# Patient Record
Sex: Female | Born: 1945 | Race: White | Hispanic: No | Marital: Married | Smoking: Never smoker
Health system: Southern US, Community
[De-identification: ages and names within clinical notes are randomized; demographics above are authoritative.]

## PROBLEM LIST (undated history)

## (undated) DIAGNOSIS — M1990 Unspecified osteoarthritis, unspecified site: Secondary | ICD-10-CM

## (undated) DIAGNOSIS — Z923 Personal history of irradiation: Secondary | ICD-10-CM

## (undated) DIAGNOSIS — Z803 Family history of malignant neoplasm of breast: Secondary | ICD-10-CM

## (undated) DIAGNOSIS — C50919 Malignant neoplasm of unspecified site of unspecified female breast: Secondary | ICD-10-CM

## (undated) DIAGNOSIS — F329 Major depressive disorder, single episode, unspecified: Secondary | ICD-10-CM

## (undated) DIAGNOSIS — Z8719 Personal history of other diseases of the digestive system: Secondary | ICD-10-CM

## (undated) DIAGNOSIS — Z806 Family history of leukemia: Secondary | ICD-10-CM

## (undated) DIAGNOSIS — I10 Essential (primary) hypertension: Secondary | ICD-10-CM

## (undated) DIAGNOSIS — F419 Anxiety disorder, unspecified: Secondary | ICD-10-CM

## (undated) DIAGNOSIS — Z800 Family history of malignant neoplasm of digestive organs: Secondary | ICD-10-CM

## (undated) DIAGNOSIS — F32A Depression, unspecified: Secondary | ICD-10-CM

## (undated) DIAGNOSIS — D649 Anemia, unspecified: Secondary | ICD-10-CM

## (undated) DIAGNOSIS — M797 Fibromyalgia: Secondary | ICD-10-CM

## (undated) HISTORY — PX: NECK SURGERY: SHX720

## (undated) HISTORY — DX: Anxiety disorder, unspecified: F41.9

## (undated) HISTORY — DX: Personal history of other diseases of the digestive system: Z87.19

## (undated) HISTORY — DX: Fibromyalgia: M79.7

## (undated) HISTORY — PX: BACK SURGERY: SHX140

## (undated) HISTORY — DX: Family history of leukemia: Z80.6

## (undated) HISTORY — DX: Depression, unspecified: F32.A

## (undated) HISTORY — DX: Malignant neoplasm of unspecified site of unspecified female breast: C50.919

## (undated) HISTORY — PX: CHOLECYSTECTOMY: SHX55

## (undated) HISTORY — DX: Family history of malignant neoplasm of breast: Z80.3

## (undated) HISTORY — PX: HYSTERECTOMY ABDOMINAL WITH SALPINGECTOMY: SHX6725

## (undated) HISTORY — DX: Family history of malignant neoplasm of digestive organs: Z80.0

## (undated) HISTORY — PX: TONSILLECTOMY: SUR1361

## (undated) HISTORY — DX: Anemia, unspecified: D64.9

## (undated) HISTORY — PX: COLONOSCOPY: SHX174

## (undated) HISTORY — DX: Major depressive disorder, single episode, unspecified: F32.9

---

## 1980-07-05 DIAGNOSIS — Z9481 Bone marrow transplant status: Secondary | ICD-10-CM

## 1980-07-05 HISTORY — DX: Bone marrow transplant status: Z94.81

## 1998-07-18 ENCOUNTER — Encounter: Payer: Self-pay | Admitting: Neurosurgery

## 1998-07-22 ENCOUNTER — Encounter: Payer: Self-pay | Admitting: Neurosurgery

## 1998-07-22 ENCOUNTER — Observation Stay (HOSPITAL_COMMUNITY): Admission: RE | Admit: 1998-07-22 | Discharge: 1998-07-22 | Payer: Self-pay | Admitting: Neurosurgery

## 1999-05-21 ENCOUNTER — Inpatient Hospital Stay (HOSPITAL_COMMUNITY): Admission: RE | Admit: 1999-05-21 | Discharge: 1999-05-22 | Payer: Self-pay | Admitting: Neurosurgery

## 1999-05-21 ENCOUNTER — Encounter: Payer: Self-pay | Admitting: Neurosurgery

## 1999-06-23 ENCOUNTER — Ambulatory Visit (HOSPITAL_COMMUNITY): Admission: RE | Admit: 1999-06-23 | Discharge: 1999-06-23 | Payer: Self-pay | Admitting: Neurosurgery

## 1999-06-23 ENCOUNTER — Encounter: Payer: Self-pay | Admitting: Neurosurgery

## 1999-07-30 ENCOUNTER — Ambulatory Visit (HOSPITAL_COMMUNITY): Admission: RE | Admit: 1999-07-30 | Discharge: 1999-07-30 | Payer: Self-pay | Admitting: Gastroenterology

## 1999-08-18 ENCOUNTER — Other Ambulatory Visit: Admission: RE | Admit: 1999-08-18 | Discharge: 1999-08-18 | Payer: Self-pay | Admitting: Gynecology

## 1999-08-31 ENCOUNTER — Ambulatory Visit (HOSPITAL_COMMUNITY): Admission: RE | Admit: 1999-08-31 | Discharge: 1999-08-31 | Payer: Self-pay | Admitting: Neurosurgery

## 1999-08-31 ENCOUNTER — Encounter: Payer: Self-pay | Admitting: Neurosurgery

## 1999-12-18 ENCOUNTER — Emergency Department (HOSPITAL_COMMUNITY): Admission: EM | Admit: 1999-12-18 | Discharge: 1999-12-18 | Payer: Self-pay | Admitting: Emergency Medicine

## 2000-03-24 ENCOUNTER — Encounter: Payer: Self-pay | Admitting: Neurosurgery

## 2000-03-24 ENCOUNTER — Ambulatory Visit (HOSPITAL_COMMUNITY): Admission: RE | Admit: 2000-03-24 | Discharge: 2000-03-24 | Payer: Self-pay | Admitting: Neurosurgery

## 2001-01-04 ENCOUNTER — Other Ambulatory Visit: Admission: RE | Admit: 2001-01-04 | Discharge: 2001-01-04 | Payer: Self-pay | Admitting: Gynecology

## 2001-01-10 ENCOUNTER — Encounter: Payer: Self-pay | Admitting: Gynecology

## 2001-01-10 ENCOUNTER — Encounter: Admission: RE | Admit: 2001-01-10 | Discharge: 2001-01-10 | Payer: Self-pay | Admitting: Gynecology

## 2002-04-18 ENCOUNTER — Other Ambulatory Visit: Admission: RE | Admit: 2002-04-18 | Discharge: 2002-04-18 | Payer: Self-pay | Admitting: Gynecology

## 2002-05-10 ENCOUNTER — Encounter: Admission: RE | Admit: 2002-05-10 | Discharge: 2002-05-10 | Payer: Self-pay | Admitting: Gynecology

## 2002-05-10 ENCOUNTER — Encounter: Payer: Self-pay | Admitting: Gynecology

## 2003-08-06 ENCOUNTER — Other Ambulatory Visit: Admission: RE | Admit: 2003-08-06 | Discharge: 2003-08-06 | Payer: Self-pay | Admitting: Gynecology

## 2003-08-08 ENCOUNTER — Encounter: Admission: RE | Admit: 2003-08-08 | Discharge: 2003-08-08 | Payer: Self-pay | Admitting: Gynecology

## 2005-04-28 ENCOUNTER — Other Ambulatory Visit: Admission: RE | Admit: 2005-04-28 | Discharge: 2005-04-28 | Payer: Self-pay | Admitting: Gynecology

## 2005-05-21 ENCOUNTER — Encounter: Admission: RE | Admit: 2005-05-21 | Discharge: 2005-05-21 | Payer: Self-pay | Admitting: Gynecology

## 2006-06-06 ENCOUNTER — Other Ambulatory Visit: Admission: RE | Admit: 2006-06-06 | Discharge: 2006-06-06 | Payer: Self-pay | Admitting: Gynecology

## 2006-06-15 ENCOUNTER — Encounter: Admission: RE | Admit: 2006-06-15 | Discharge: 2006-06-15 | Payer: Self-pay | Admitting: Gynecology

## 2006-06-17 ENCOUNTER — Encounter: Admission: RE | Admit: 2006-06-17 | Discharge: 2006-06-17 | Payer: Self-pay | Admitting: Gynecology

## 2006-12-21 ENCOUNTER — Encounter: Admission: RE | Admit: 2006-12-21 | Discharge: 2006-12-21 | Payer: Self-pay | Admitting: Gynecology

## 2007-06-20 ENCOUNTER — Other Ambulatory Visit: Admission: RE | Admit: 2007-06-20 | Discharge: 2007-06-20 | Payer: Self-pay | Admitting: Gynecology

## 2007-06-23 ENCOUNTER — Encounter: Admission: RE | Admit: 2007-06-23 | Discharge: 2007-06-23 | Payer: Self-pay | Admitting: Gynecology

## 2007-11-28 ENCOUNTER — Encounter: Admission: RE | Admit: 2007-11-28 | Discharge: 2007-11-28 | Payer: Self-pay | Admitting: Neurosurgery

## 2008-01-01 ENCOUNTER — Inpatient Hospital Stay (HOSPITAL_COMMUNITY): Admission: RE | Admit: 2008-01-01 | Discharge: 2008-01-03 | Payer: Self-pay | Admitting: Neurosurgery

## 2008-01-26 ENCOUNTER — Emergency Department (HOSPITAL_COMMUNITY): Admission: EM | Admit: 2008-01-26 | Discharge: 2008-01-26 | Payer: Self-pay | Admitting: Emergency Medicine

## 2008-01-26 ENCOUNTER — Ambulatory Visit: Payer: Self-pay | Admitting: Psychiatry

## 2008-01-27 ENCOUNTER — Inpatient Hospital Stay (HOSPITAL_COMMUNITY): Admission: EM | Admit: 2008-01-27 | Discharge: 2008-01-29 | Payer: Self-pay | Admitting: Psychiatry

## 2008-01-31 ENCOUNTER — Other Ambulatory Visit (HOSPITAL_COMMUNITY): Admission: RE | Admit: 2008-01-31 | Discharge: 2008-02-08 | Payer: Self-pay | Admitting: Psychiatry

## 2008-03-26 ENCOUNTER — Ambulatory Visit (HOSPITAL_COMMUNITY): Payer: Self-pay | Admitting: Marriage and Family Therapist

## 2008-04-08 ENCOUNTER — Ambulatory Visit (HOSPITAL_COMMUNITY): Payer: Self-pay | Admitting: Marriage and Family Therapist

## 2008-04-16 ENCOUNTER — Ambulatory Visit (HOSPITAL_COMMUNITY): Payer: Self-pay | Admitting: Marriage and Family Therapist

## 2008-04-23 ENCOUNTER — Ambulatory Visit (HOSPITAL_COMMUNITY): Payer: Self-pay | Admitting: Marriage and Family Therapist

## 2008-05-02 ENCOUNTER — Ambulatory Visit (HOSPITAL_COMMUNITY): Payer: Self-pay | Admitting: Marriage and Family Therapist

## 2008-05-03 ENCOUNTER — Ambulatory Visit (HOSPITAL_COMMUNITY): Payer: Self-pay | Admitting: Psychiatry

## 2008-05-15 ENCOUNTER — Ambulatory Visit (HOSPITAL_COMMUNITY): Payer: Self-pay | Admitting: Psychiatry

## 2008-07-15 ENCOUNTER — Ambulatory Visit (HOSPITAL_COMMUNITY): Payer: Self-pay | Admitting: Psychiatry

## 2008-07-22 ENCOUNTER — Other Ambulatory Visit (HOSPITAL_COMMUNITY): Admission: RE | Admit: 2008-07-22 | Discharge: 2008-09-18 | Payer: Self-pay | Admitting: Psychiatry

## 2008-09-10 ENCOUNTER — Inpatient Hospital Stay (HOSPITAL_COMMUNITY): Admission: RE | Admit: 2008-09-10 | Discharge: 2008-09-13 | Payer: Self-pay | Admitting: Psychiatry

## 2008-09-10 ENCOUNTER — Ambulatory Visit: Payer: Self-pay | Admitting: Psychiatry

## 2008-09-23 ENCOUNTER — Encounter: Admission: RE | Admit: 2008-09-23 | Discharge: 2008-09-23 | Payer: Self-pay | Admitting: Gynecology

## 2008-09-30 ENCOUNTER — Encounter: Admission: RE | Admit: 2008-09-30 | Discharge: 2008-09-30 | Payer: Self-pay | Admitting: Gynecology

## 2009-10-01 ENCOUNTER — Encounter: Admission: RE | Admit: 2009-10-01 | Discharge: 2009-10-01 | Payer: Self-pay | Admitting: Internal Medicine

## 2010-02-25 ENCOUNTER — Encounter: Admission: RE | Admit: 2010-02-25 | Discharge: 2010-02-25 | Payer: Self-pay | Admitting: Surgery

## 2010-03-10 ENCOUNTER — Encounter: Admission: RE | Admit: 2010-03-10 | Discharge: 2010-03-10 | Payer: Self-pay | Admitting: Surgery

## 2010-03-10 HISTORY — PX: BREAST BIOPSY: SHX20

## 2010-03-29 ENCOUNTER — Encounter: Admission: RE | Admit: 2010-03-29 | Discharge: 2010-03-29 | Payer: Self-pay | Admitting: Neurosurgery

## 2010-05-06 ENCOUNTER — Ambulatory Visit (HOSPITAL_COMMUNITY): Admission: RE | Admit: 2010-05-06 | Discharge: 2010-05-06 | Payer: Self-pay | Admitting: Cardiovascular Disease

## 2010-07-29 ENCOUNTER — Encounter
Admission: RE | Admit: 2010-07-29 | Discharge: 2010-07-29 | Payer: Self-pay | Source: Home / Self Care | Attending: Neurosurgery | Admitting: Neurosurgery

## 2010-10-19 LAB — URINE DRUGS OF ABUSE SCREEN W ALC, ROUTINE (REF LAB)
Barbiturate Quant, Ur: NEGATIVE
Benzodiazepines.: NEGATIVE
Ethyl Alcohol: <10 mg/dL (ref <10)
Marijuana Metabolite: NEGATIVE
Methadone: NEGATIVE
Phencyclidine (PCP): NEGATIVE
Propoxyphene: NEGATIVE

## 2010-11-17 NOTE — H&P (Signed)
NAMEKRISTYNE, WOODRING NO.:  [id number]   MEDICAL RECORD NO.:  [id number]          PATIENT TYPE:  IPS   LOCATION:  0504                          FACILITY:  [facility]   PHYSICIAN:  Geoffery Lyons, M.D.      DATE OF BIRTH:  1945/07/17   DATE OF ADMISSION:  09/10/2008  DATE OF DISCHARGE:                       PSYCHIATRIC ADMISSION ASSESSMENT   The patient is a 65 year old female voluntarily admitted.   HISTORY OF PRESENT ILLNESS:  The patient presents with a history of  passive suicidal thoughts, stating what am I doing here, feeling very  overwhelmed with adjusting going back to work, remaining sober off  alcohol for the past 2 months, attending meetings at [facility] and AA and  dealing with her mother who is decompensating in a nursing home.  She  endorses that she knows she has a lot to live for and has not relapsed  in the past 2 months during these stressful situations.  She has been  compliant with her medications, but feels that it is not totally helping  out with her depressive symptoms.   PAST PSYCHIATRIC HISTORY:  The patient was here in July of 2009 for  alcohol use.  This is her second admission.  She was attending the [facility]  program and was going to [employer] three times a week.   SOCIAL HISTORY:  This is a 65 year old married female, married for 42  years.  Her husband is at home and has multiple medical problems.  The  patient lives in [place], [place] and works for the city in the  [employer].   FAMILY HISTORY:  None.   ALCOHOL AND DRUG HISTORY:  The patient has a history of alcohol use but  has been sober for 2 months.  Was drinking wine.  Denies any substance  use.   PRIMARY CARE Ranard Harte:  Dr. Pamelia Hoit.   MEDICAL PROBLEMS:  1. Hypertension.  2. Back pain.  3. GERD symptoms.   MEDICATIONS:  Listed as:  1. Nexium 40 mg daily.  2. Cymbalta 60 mg daily.  3. Coreg CR 20 mg daily.  4. Diovan hydrochlorothiazide 320/25 daily.  5.  Fexofenadine 180 mg daily.  6. Ambien 10 mg p.r.n.  7. Vaginal ring every 3 months.  8. Citracal two daily.  9. Chondroitin triple daily with a multivitamin.  10.Etodolac 400 mg taking two daily, for the last use on that      medication 2 weeks ago.   DRUG ALLERGIES:  No known allergies.   PHYSICAL EXAMINATION:  GENERAL:  This is a middle-aged female.  She  appears well-nourished, well-developed, and she is in no acute distress.  Denies any complaints.  VITAL SIGNS:  Temperature of 97, heart rate 67, respirations 20  respirations, blood pressure 153/97, 5 feet 6 inches tall, 159 pounds.   LABORATORY DATA:  Unavailable.   MENTAL STATUS EXAM:  This is a pleasant, talkative female, casually  dressed, good eye contact.  Speech is articulate, normal pace and tone.  The patient's mood is depressed to her.  She is a pleasant but appears  sad.  Her thought  processes are coherent and goal directed.  No evidence  of any delusional statements.  Denies any suicidality.  Cognitive  function intact.  Her memory is good.  Judgment and insight appears to  be good.   IMPRESSION:  AXIS I:  Major depressive disorder, recurrent, with alcohol  abuse, in partial remission.  AXIS II:  Deferred.  AXIS III:  Hypertension, back pain and gastroesophageal reflux disease  symptoms.  AXIS IV:  Psychosocial problems and problems with occupation and  possible problems with primary support group reporting that her husband  at this time is controlling.  AXIS V:  Current is 40.   PLAN:  We will augment the patient's Cymbalta with Abilify.  Risks and  benefits were discussed with the patient.  The patient is agreeable to  begin the medication.  Will continue work on relapse prevention.  The  patient is in the dual diagnosis program.  We will encourage the patient  to attend AA meetings and offer the [facility] program again.  Reinforce  medication compliance.  The patient may benefit from some individual  therapy  year.  Her tentative length of stay at this time is 1-2 days.      Landry Corporal, N.P.      Geoffery Lyons, M.D.  Electronically Signed    JO/MEDQ  D:  09/11/2008  T:  09/12/2008  Job:  [id number]

## 2010-11-17 NOTE — Op Note (Signed)
NAMEBENNIE, [name]             ACCOUNT NO.:  [id number]   MEDICAL RECORD NO.:  [id number]          PATIENT TYPE:  INP   LOCATION:  3172                         FACILITY:  [facility]   PHYSICIAN:  Kathaleen Maser. Pool, M.D.    DATE OF BIRTH:  September 04, 1945   DATE OF PROCEDURE:  01/01/2008  DATE OF DISCHARGE:                               OPERATIVE REPORT   PREOPERATIVE DIAGNOSES:  1. L3-L4 grade 2 post laminectomy/degenerative spondylolisthesis with      severe stenosis.  2. Status post L4-L5 decompression and fusion with instrumentation.   POSTOPERATIVE DIAGNOSES:  1. L3-L4 grade 2 post laminectomy/degenerative spondylolisthesis with      severe stenosis.  2. Status post L4-L5 decompression and fusion with instrumentation.   PROCEDURE:  1. Re-exploration of L3-L4 laminectomy with complete L3-L4 laminectomy      and foraminotomies, more than would be required for simple      interbody fusion alone.  2. L3-L4 posterior lumbar interbody fusion utilizing tangent interbody      and allograft wedge, Telamon interbody PEEK cage, and local      autografting.  3. L3-L4 posterolateral arthrodesis utilizing nonsegmental pedicle      screw fixation and local autografting.  4. Re-exploration of L4-L5 fusion with removal of hardware.   SURGEON:  Kathaleen Maser. Pool, MD   ASSISTANT:  Donalee Citrin, MD   ANESTHESIA:  General endotracheal.   INDICATIONS FOR PROCEDURE:  [name] is a 65 year old female who is  status post previous L4-L5 decompression and fusion approximately 8  years ago.  The patient presents now with severe back and lower  extremity symptoms failing conservative management.  Workup demonstrates  evidence of a grade 2 L3-L4 degenerative spondylolisthesis with severe  stenosis.  The patient presents now for decompression and fusion at the  L3-L4 level.  This will require re-exploration and removal of her  previous L4-L5 hardware.  Fusion appears to be solid on preoperative  imaging.   OPERATIVE NOTE:  The patient was placed on the operative table in supine  position.  After adequate level of anesthesia was achieved, the patient  was positioned prone onto a Wilson frame.  Appropriately padded the  patient's lumbar region, prepped and draped sterilely.  A 10 blade was  used to make a curvilinear skin incision, overlying the L3, L4, and L5  levels.  This was carried down sharply in the midline.  Subperiosteal  dissection was then performed exposing the lamina and facet joints of L3  and L4.  The previously placed pedicle screw fixation at L4-L5 was  dissected free.  Deep self-retaining retractors were placed.  Intraoperative fluoroscopy was used and levels were confirmed.  Pedicle  screw fixation at L4-L5 was then disassembled.  Screws were then  removed.  Fusion was infected at L4-L5 and found to be solid.  A 6.75 x  40 mm radius screws were placed into the L4 holes bilaterally.  The  previous laminectomy at L4-L5 was then dissected free up to the level of  the inferior lamina of L3.  Epidural scar was dissected free.  A  complete laminectomy  of L3 was then performed as were inferior  facetectomies of L3 bilaterally and superior facetectomies of L4  bilaterally.  All bone was cleaned and used in later autografting.  Ligament flavum and epidural scar were then elevated and resected in  usual fashion using Kerrison rongeurs in and around thecal sac and  exiting L3-L4 nerve roots identified bilaterally.  Wide decompressive  foraminotomies were then performed along course of the exiting nerve  roots bilaterally.  Epidural venous plexus was coagulated and cut.  Disk  space at L3-L4 was very collapsed and irregular secondary to the marked  spondylolisthesis.  The annulus was incised.  A distractor was then  impacted into the disk space.  The disk space was then sequentially  dilated up to 9 mm with a 9-mm distractor left in the patient's right  side.  Thecal sac and nerve roots  were checked on the left side.  Disk  space was then reamed and then cut with 8 mm tangent instrument.  Soft  tissues were then removed from the interspace.  An 8 x 22 mm tangent  wedge was then packed into place and recessed roughly 2 mm in posterior  cortical margin of L3.  Distractors were removed from the patient's  right side.  Thecal sac and nerve roots were once again protected on the  right side.  Disk space was then reamed and then cut with tangent  instruments.  Soft tissues were then removed from the interspace.  Disk  space further curettaged.  Morselized autograft was packed within the  interspace for later fusion.  An 8 x 22 mm Telamon cage packed with  morselized autograft and then packed in place and recessed roughly 2 mm  posterior margin of L3.  Pedicles of L3 were then identified  bilaterally.  This was done using surface landmarks and intraoperative  fluoroscopy.  A superficial bone around the pedicle was then removed  using high-speed drill.  Each pedicle was then probed using pedicle awl.  Each pedicle awl tract was then tapped with 5.25-mm screw tapper.  Each  screw hole was probed and found be solid with bone.  A 5.75 x 40 mm  radius screws were placed bilaterally at L3.  It should be noted on the  patient's left side, pedicle screw fixation was very challenging  secondary to very small pedicle, but eventually the pedicle screw  placement was performed although the screw itself does come close to the  superior endplate of the L3 vertebral body, it does not appear to  violate the disk space at L3-L4.  Transverse processes of L3 and L4 were  then decorticated using high speed drill.  Morselized autograft was  packed posterolaterally for later fusion.  Short segment titanium rods  were then contoured and placed with screw heads at L3-L4.  Locking caps  were then placed over the screw.  The locking caps were then engaged and  constructed under compression.  Final images  revealed good position of  bone grafts, hardware with proper operative level, and normal alignment  of spine.  The wound was then irrigated with antibiotic solution.  Gelfoam was placed topically for hemostasis and found to be good.  A  medium Hemovac drain was left in interspace.  The wounds were then  closed in layers with Vicryl sutures.  Steri-Strips and sterile  dressings were applied.  There were no complications.  The patient  tolerated the procedure well and she returned to recovery room  postoperatively.  ______________________________  Kathaleen Maser Pool, M.D.     HAP/MEDQ  D:  01/01/2008  T:  01/02/2008  Job:  [id number]

## 2010-11-17 NOTE — H&P (Signed)
[name], KURDZIEL NO.:  [id number]   MEDICAL RECORD NO.:  [id number]          PATIENT TYPE:  IPS   LOCATION:  0402                          [facility]ACILITY[facility]  [facility]   PHYSICIAN:  Anselm Jungling, MD  DATE OF BIRTH:  07-Oct-1945   DATE OF ADMISSION:  01/27/2008  DATE OF DISCHARGE:                       PSYCHIATRIC ADMISSION ASSESSMENT   This is an involuntary admission to the services of Anselm Jungling,  M.D.   This is a 65 year old married white female.  She was brought to the  emergency department by her husband yesterday.  Apparently on her way to  the emergency department she tried to jump out of the car.  She has  become more depressed over the last couple of days.  She has been having  thoughts of wanting to die.  States she is tired of living like this.  She reported that her stressors include being taken advantage of at  work, being overlooked for promotions.  She reports being stressed out  about her stressed mother, who resides in [place].  She reports  decreased sleep and appetite, etc.  She also has been over utilizing her  prescribed opiates and benzodiazepines.  She has also recently been on  prednisone, and all of the above may be contributing to her change in  mood.   PAST PSYCHIATRIC HISTORY:  She has had no prior treatment.   SOCIAL HISTORY:  She is a high Garment/textile technologist.  She has been married  once.  Her oldest son would have been 50, but he died from leukemia at  age 86.  She has 2 other living sons, 53 and 30.   FAMILY HISTORY:  She denies.   ALCOHOL AND DRUG HISTORY:  She denies.   PRIMARY CARE Emagene Merfeld:  Pamelia Hoit.   MEDICAL PROBLEMS:  Hypertension and chronic back pain.   MEDICATIONS:  We checked with [facility] [address] and she is  prescribed Nexium 40 mg p.o. daily, Diovan 320/25, Wellbutrin XL 150 mg  p.o. daily, hydroxyzine 50 mg q.8h. p.r.n., methylprednisolone 4 mg p.o.  daily, Vicodin 10/325 one to two tabs every  six hours p.r.n., prednisone  10 mg p.o. daily, diazepam 5 mg 1 to 2 tabs q.6h. p.r.n., oxycodone  5/325 one to two tabs every four to six hours p.r.n.   DRUG ALLERGIES:  No known drug allergies.   She was medically cleared in the ED at [facility]  She is status post a  hysterectomy and gallbladder surgery about 10 years ago.  She reports  having had back surgery June 9th and apparently she is now over-  utilizing her medications.   POSITIVE PHYSICAL FINDINGS:  VITAL SIGNS:  Vital signs on admission show  she is 65 inches tall, weighs 149.  Temperature is 94.1, blood pressure  is 124/88 to 147/99.  Pulse is 89, respirations are 18.  GENERAL:  Today she is moving well.  She jumps up out of the bed.  She  just seems to be having withdrawal from over-utilizing her opioids.  NEUROLOGIC:  Mental status exam:  She is alert, she is oriented.  She is  casually groomed and dressed in hospital scrubs.  She has occasionally  good eye contact.  Her speech:  She was screaming.  Her mood is  irritable.  She is agitated.  Her thought processes are not completely  clear, rational or goal oriented.  She seems to have some issues with  paranoia or delusions.  She feels passed over, etc., etc.  Judgment and  insight are poor.  Concentration and memory are fair.  Intelligence is  at least average.  She denies being actively suicidal, she denies being  homicidal, and she denies auditory or visual hallucinations.   DIAGNOSIS:  AXIS I:  Major depressive disorder.  Prednisone-induced  psychosis.  Opiate abuse, probably in withdrawal.  AXIS III:  Status post back surgery December 12, 2007.  AXIS IV:  Medical problems, back pain.  Probably has steroid-induced  psychosis and over-utilization of her pain medications.  AXIS V:  35.   The plan is to help her detox.  Towards that end, we will stop the  opiates, benzodiazepines and prednisone.  We will put her on the  clonidine detox protocol, and we will give her  Zyprexa Zydis 5 mg p.o.  p.r.n.   Estimated length of stay is 4 to 5 days.      Mickie Leonarda Salon, P.A.-C.      Anselm Jungling, MD  Electronically Signed    MD/MEDQ  D:  01/27/2008  T:  01/27/2008  Job:  [id number]

## 2010-11-17 NOTE — Discharge Summary (Signed)
[name], [name] NO.:  [id number]   MEDICAL RECORD NO.:  [id number]          PATIENT TYPE:  IPS   LOCATION:  0303                          FACILITY:  [facility]   PHYSICIAN:  Geoffery Lyons, M.D.      DATE OF BIRTH:  1946/07/02   DATE OF ADMISSION:  01/27/2008  DATE OF DISCHARGE:  01/29/2008                               DISCHARGE SUMMARY   CHIEF COMPLAINT AND PRESENT ILLNESS:  This was the first admission to  [facility] for this 65 year old married white female,  brought to the emergency department by her husband.  Apparently on her  way to the emergency department she tried to jump out of the car.  Became more depressed over the last couple of days, has been having  thoughts of wanting to die.  Endorsed that she is tired of living like  this, reports that her stress includes being taken advantage of at work,  being overlooked for promotion, being stressed out about her stressed  mother who resides in [place].  Reports decreased sleep and appetite.  Has been over-utilizing her prescribed opiates and benzodiazepines.  She  has also recently been on prednisone.   PAST PSYCHIATRIC HISTORY:  No prior treatment.   ALCOHOL AND DRUG HISTORY:  Denies active use of any substances.   MEDICAL HISTORY:  1. Hypertension.  2. Chronic back pain.   MEDICATIONS:  1. Nexium 40 mg per day.  2. Diovan 320/25 one daily.  3. Wellbutrin XL 150 daily.  4. Hydroxyzine 50 mg every eight hours as needed.  5. Methylprednisolone 4 mg daily.  6. Vicodin 10/325 one to two every 6 hours as needed.  7. Prednisone 10 mg per day.  8. Valium 5 mg one to two every 6 hours as needed.  9. Oxycodone 5/325 one to two tablets every 4-6 hours as needed.   PHYSICAL EXAMINATION:  Failed to show any acute findings.   LABORATORY WORK-UP:  White blood cells 13.1, hemoglobin 13.2.   MENTAL STATUS EXAM:  Revealed an alert cooperative female, casually  groomed and dressed.  Good eye  contact.  Speech was normal in rate,  tempo and production.  Very labile, initially screaming, irritable,  easily agitated.  Initially her thought processes were not completely  clear.  Upset with work as she felt she was being passed over.  No  active suicidal or homicidal ideas, no delusions, no hallucinations.  Cognition well-preserved.   ADMISSION DIAGNOSES:  AXIS I:  1.  Major depressive disorder.  2.  Rule  out steroid induced psychoses.  3.  Opiate abuse.  4.  Rule out  withdrawal.  AXIS II:  No diagnosis.  AXIS III:  Status post back surgery in June 2009.  AXIS IV:  Moderate.  AXIS V:  Upon admission 35, highest in the past year 60.   COURSE IN THE HOSPITAL:  She was admitted, started in individual and  group psychotherapy.  Given the fact that there were some concerns about  her abusing opiates we went ahead and detoxed.  She was given some  Zyprexa for the acute  agitation.  She was able to initially open up and  talk about her stressors.  Endorsed decreased sleep, anxiety, racing  thoughts, feeling disconnected to herself.  July 26 she was better but  feeling a little disconnected, but less labile.  July 27 she was better,  she endorsed she had been depressed for a long time and not realized it,  endorsed she was that depressed.  Trying to get her mother to this area.  Three sons, one died when he was 40 with leukemia.  Was trying to keep  things going, there is some unresolved grief issues.  She was more  insightful.  She realized that there were issues that she needed to deal  with and she was willing to do that and she was in full contact with  reality without any suicidal or homicidal ideations.  No concerns about  safety from the family.  We went ahead and discharged to outpatient  follow-up.   DISCHARGE DIAGNOSES:  AXIS I:  1.  Major depressive disorder.  2.  Opiate/benzodiazepine abuse.  AXIS II:  No diagnosis.  AXIS III:  Status post back surgery.  AXIS IV:   Moderate.  AXIS V:  Upon discharge 50.   Discharged on:  1. Diovan 320/25.  2. Hydrochlorothiazide 1 daily.  3. Nexium 40 mg per day.   FOLLOWUP:  [facility].      Geoffery Lyons, M.D.  Electronically Signed     IL/MEDQ  D:  02/27/2008  T:  02/28/2008  Job:  [id number]

## 2010-11-20 NOTE — Discharge Summary (Signed)
[name], LECKER NO.:  [id number]   MEDICAL RECORD NO.:  [id number]          PATIENT TYPE:  IPS   LOCATION:  0504                          FACILITY:  [facility]   PHYSICIAN:  Geoffery Lyons, M.D.      DATE OF BIRTH:  08-Sep-1945   DATE OF ADMISSION:  09/10/2008  DATE OF DISCHARGE:  09/13/2008                               DISCHARGE SUMMARY   CHIEF COMPLAINT:  This was the second admission to [facility] for this 65 year old female voluntarily admitted.  She had  history of passive suicidal thoughts, stating what am I doing here,  feeling very overwhelmed with adjusting going back to work, remaining  sober from alcohol for the past 2 months, attending meetings at [facility]  and AA, dealing with her mother who is decompensating in a nursing home.  She endorsed that she knows she has a lot live for and has not relapsed  in the past 2 months during this stressful situation.  She had been  compliant with her medication but feels that she is not completely out  of the depression.   PAST PSYCHIATRIC HISTORY:  She was admitted in July 2009 for alcohol  use.  This is the second admission.  She is active in the [facility] program  up until several weeks ago.   ALCOHOL AND DRUG HISTORY:  History of alcohol abuse, sober for 2 months,  mostly wine.   MEDICAL HISTORY:  1. Hypertension.  2. Back pain.  3. Gastroesophageal reflux.   MEDICATION:  1. Nexium 40 mg per day.  2. Cymbalta 60 mg per day.  3. Coreg CR 20 mg per day.  4. Diovan/hydrochlorothiazide 320/25 mg per day.  5. Fexofenadine 180 mg per day.  6. Ambien 10 at bedtime for sleep.  7. Etodolac 500 mg 2 daily.   PHYSICAL EXAMINATION:  Failed to show any acute findings.   LABORATORY WORKUP:  Not available in the chart.   MENTAL STATUS EXAMINATION:  Reveals alert cooperative female casually  dressed, good eye contact.  Speech is articulate, normal in pace, tone  and production.  Mood is depressed.   Affect is depressed, tearful at  times, feeling pretty overwhelmed as the depression is still going on.  She did admit that she got very overwhelmed and stressed out keeping the  job, the [employer], going to meetings.  She is concerned about her job  situation as she is so close to retirement that she would not want to  jeopardize that, but at the same time endorses a lot of stress there.  No suicidal or homicidal ideas, no delusions.  No hallucinations.  Cognition is well preserved.   DIAGNOSES:  Axis I:  Major depressive disorder, alcohol abuse.  Axis II:  No diagnosis.  Axis III:  Hypertension, back pain, gastroesophageal reflux.  Axis IV:  Moderate.  Axis V:  Upon admission 40, highest GAF in the last year 70.   COURSE IN THE HOSPITAL:  She was admitted and started individual and  group psychotherapy.  She was maintained on the Cymbalta.  She was  started on  Abilify 2 mg twice a day.  As already stated, she endorsed  mood fluctuations.  She is up, happy, doing things, then just snaps and  all of a sudden feels sad, cries with no energy, no motivation, unable  to function, thinking about death, becomes hopeless and helpless.  She  endorsed being tense, anxious, conflict with the husband who she claims  is dominating.  She was on Cymbalta 60, then Wellbutrin and then back on  Cymbalta.  She had dealt with the death of her son, moving mother to a  nursing home.  She endorsed that she had pushed things down, was going  to retire on disability.  She is home all the time and used to abuse  opioids.  She was drinking wine and taking pain pills.  After the  opiates, increased use of wine, then stopped.  She was going to AA and  CDI[facility].  She also endorsed fibromyalgia. __________  bathroom was found  __________ when she blacked out.  It was pretty clear that she needed to  do something.  On September 02, 2008, she endorsed she was starting to feel  better.  In looking back, she endorsed that she was  under a lot of  stress, trying to be in [facility], work, attending AA, and dealing with her  mother.  She endorsed that she will never get to this situation again.  She will continue to work on going to [employer] and seeing a Veterinary surgeon.  On  September 13, 2008, she was in full contact with reality.  There were no  active suicidal of homicidal ideas, no hallucinations or delusions.  She  said that she was feeling somewhat better.  Objectively, her affect was  brighter.  No crying spells.  She said she has not felt like this in a  long time.  It is still unclear what she wants to do with work and when  she was going to go back.  She was going to pursue outpatient treatment  and see a counselor through [facility].   DISCHARGE DIAGNOSES:  Axis I:  Major depressive disorder, alcohol abuse.  Axis II:  No diagnosis.  Axis III:  Hypertension, back pain, fibromyalgia.  Axis IV:  Moderate.  Axis V:  Upon discharge 55-60.   DISCHARGE MEDICATIONS:  1. Abilify 2 mg twice a day.  2. Nexium 40 mg per day.  3. Cymbalta 60 mg per day.  4. Coreg CR 20 mg per day.  5. Diovan/hydrochlorothiazide 320/25 daily.  6. Fexofenadine 180 mg per day.  7. Etodolac 400 mg 2 daily.  8. Ambien 10 mg at bedtime for sleep.   FOLLOWUP:  [facility].      Geoffery Lyons, M.D.  Electronically Signed     IL/MEDQ  D:  10/09/2008  T:  10/09/2008  Job:  [id number]

## 2011-04-01 LAB — BASIC METABOLIC PANEL
BUN: 6
CO2: 30
Calcium: 9.2
Chloride: 94 — ABNORMAL LOW
Creatinine, Ser: 0.84
GFR calc Af Amer: >60

## 2011-04-01 LAB — CBC
HCT: 39.6
Hemoglobin: 13.9
MCHC: 35.1
MCV: 86.4
Platelets: 319
RDW: 13.3

## 2011-04-01 LAB — DIFFERENTIAL
Basophils Absolute: 0.1
Eosinophils Absolute: 0.1
Eosinophils Relative: 2
Lymphocytes Relative: 25
Monocytes Absolute: 0.7

## 2011-04-01 LAB — TYPE AND SCREEN: ABO/RH(D): O POS

## 2011-04-02 LAB — CBC
HCT: 39.6
Hemoglobin: 13.7
MCHC: 34.5
MCV: 86.6
MCV: 86.6
Platelets: 469 — ABNORMAL HIGH
RBC: 4.51
RBC: 4.58
RDW: 13.7
WBC: 13.1 — ABNORMAL HIGH
WBC: 15.6 — ABNORMAL HIGH

## 2011-04-02 LAB — RAPID URINE DRUG SCREEN, HOSP PERFORMED
Amphetamines: NOT DETECTED
Barbiturates: NOT DETECTED
Benzodiazepines: POSITIVE — AB
Cocaine: NOT DETECTED
Opiates: POSITIVE — AB
Tetrahydrocannabinol: NOT DETECTED

## 2011-04-02 LAB — URINALYSIS, ROUTINE W REFLEX MICROSCOPIC
Bilirubin Urine: NEGATIVE
Glucose, UA: NEGATIVE
Hgb urine dipstick: NEGATIVE
Ketones, ur: NEGATIVE
Nitrite: NEGATIVE
Protein, ur: NEGATIVE
Specific Gravity, Urine: 1.009
Urobilinogen, UA: 0.2
pH: 7

## 2011-04-02 LAB — DIFFERENTIAL
Basophils Absolute: 0.1
Basophils Relative: 1
Eosinophils Absolute: 0.2
Eosinophils Relative: 1
Lymphocytes Relative: 20
Lymphs Abs: 3
Monocytes Absolute: 1
Monocytes Relative: 6
Neutro Abs: 11.2 — ABNORMAL HIGH
Neutrophils Relative %: 72

## 2011-04-02 LAB — COMPREHENSIVE METABOLIC PANEL
Alkaline Phosphatase: 89
BUN: 14
CO2: 28
Chloride: 98
GFR calc non Af Amer: >60
Glucose, Bld: 95
Potassium: 3.1 — ABNORMAL LOW
Total Bilirubin: 0.7

## 2011-04-02 LAB — COMPREHENSIVE METABOLIC PANEL WITH GFR
ALT: 28
AST: 20
Albumin: 3.8
Calcium: 9.3
Creatinine, Ser: 0.89
GFR calc Af Amer: >60
Sodium: 138
Total Protein: 6.3

## 2011-04-02 LAB — POCT I-STAT, CHEM 8
Creatinine, Ser: 1
HCT: 40
Hemoglobin: 13.6
Sodium: 137
TCO2: 30

## 2011-04-02 LAB — TRICYCLICS SCREEN, URINE: TCA Scrn: NOT DETECTED

## 2011-04-02 LAB — ETHANOL: Alcohol, Ethyl (B): <5

## 2011-04-02 LAB — SALICYLATE LEVEL: Salicylate Lvl: <4

## 2011-04-02 LAB — ACETAMINOPHEN LEVEL: Acetaminophen (Tylenol), Serum: <10 — ABNORMAL LOW

## 2011-10-01 ENCOUNTER — Other Ambulatory Visit: Payer: Self-pay | Admitting: Internal Medicine

## 2011-10-01 DIAGNOSIS — Z1231 Encounter for screening mammogram for malignant neoplasm of breast: Secondary | ICD-10-CM

## 2011-10-18 ENCOUNTER — Ambulatory Visit
Admission: RE | Admit: 2011-10-18 | Discharge: 2011-10-18 | Disposition: A | Discharge status: Home / Self Care | Payer: 59 | Source: Ambulatory Visit | Attending: Internal Medicine | Admitting: Internal Medicine

## 2011-10-18 DIAGNOSIS — Z1231 Encounter for screening mammogram for malignant neoplasm of breast: Secondary | ICD-10-CM

## 2011-10-19 ENCOUNTER — Other Ambulatory Visit: Payer: Self-pay | Admitting: Internal Medicine

## 2011-10-19 DIAGNOSIS — R928 Other abnormal and inconclusive findings on diagnostic imaging of breast: Secondary | ICD-10-CM

## 2011-10-20 ENCOUNTER — Ambulatory Visit
Admission: RE | Admit: 2011-10-20 | Discharge: 2011-10-20 | Disposition: A | Discharge status: Home / Self Care | Payer: 59 | Source: Ambulatory Visit | Attending: Internal Medicine | Admitting: Internal Medicine

## 2011-10-20 DIAGNOSIS — R928 Other abnormal and inconclusive findings on diagnostic imaging of breast: Secondary | ICD-10-CM

## 2011-10-25 ENCOUNTER — Other Ambulatory Visit (HOSPITAL_COMMUNITY): Payer: Self-pay | Admitting: Diagnostic Radiology

## 2011-11-08 ENCOUNTER — Other Ambulatory Visit: Payer: Self-pay | Admitting: Gynecology

## 2012-09-27 ENCOUNTER — Other Ambulatory Visit: Payer: Self-pay

## 2012-09-27 DIAGNOSIS — Z1231 Encounter for screening mammogram for malignant neoplasm of breast: Secondary | ICD-10-CM

## 2012-10-20 ENCOUNTER — Ambulatory Visit: Payer: 59

## 2012-11-13 ENCOUNTER — Ambulatory Visit
Admission: RE | Admit: 2012-11-13 | Discharge: 2012-11-13 | Disposition: A | Discharge status: Home / Self Care | Payer: Medicare Other | Source: Ambulatory Visit

## 2012-11-13 DIAGNOSIS — Z1231 Encounter for screening mammogram for malignant neoplasm of breast: Secondary | ICD-10-CM

## 2013-05-28 ENCOUNTER — Encounter (HOSPITAL_COMMUNITY): Payer: Self-pay | Admitting: Emergency Medicine

## 2013-05-28 ENCOUNTER — Emergency Department (HOSPITAL_COMMUNITY)
Admission: EM | Admit: 2013-05-28 | Discharge: 2013-05-28 | Disposition: A | Discharge status: Home / Self Care | Payer: Medicare Other | Attending: Emergency Medicine | Admitting: Emergency Medicine

## 2013-05-28 ENCOUNTER — Emergency Department (HOSPITAL_COMMUNITY): Payer: Medicare Other

## 2013-05-28 DIAGNOSIS — I10 Essential (primary) hypertension: Secondary | ICD-10-CM | POA: Insufficient documentation

## 2013-05-28 DIAGNOSIS — R112 Nausea with vomiting, unspecified: Secondary | ICD-10-CM | POA: Insufficient documentation

## 2013-05-28 DIAGNOSIS — R109 Unspecified abdominal pain: Secondary | ICD-10-CM | POA: Insufficient documentation

## 2013-05-28 DIAGNOSIS — M129 Arthropathy, unspecified: Secondary | ICD-10-CM | POA: Insufficient documentation

## 2013-05-28 DIAGNOSIS — E876 Hypokalemia: Secondary | ICD-10-CM | POA: Insufficient documentation

## 2013-05-28 DIAGNOSIS — R55 Syncope and collapse: Secondary | ICD-10-CM

## 2013-05-28 DIAGNOSIS — E871 Hypo-osmolality and hyponatremia: Secondary | ICD-10-CM | POA: Insufficient documentation

## 2013-05-28 DIAGNOSIS — Z79899 Other long term (current) drug therapy: Secondary | ICD-10-CM | POA: Insufficient documentation

## 2013-05-28 HISTORY — DX: Essential (primary) hypertension: I10

## 2013-05-28 HISTORY — DX: Unspecified osteoarthritis, unspecified site: M19.90

## 2013-05-28 LAB — CBC WITH DIFFERENTIAL/PLATELET
Basophils Absolute: 0.1 10*3/uL (ref 0.0–0.1)
Basophils Relative: 1 % (ref 0–1)
Eosinophils Absolute: 0.1 10*3/uL (ref 0.0–0.7)
Eosinophils Relative: 1 % (ref 0–5)
HCT: 35.8 % — ABNORMAL LOW (ref 36.0–46.0)
Hemoglobin: 12.4 g/dL (ref 12.0–15.0)
Lymphocytes Relative: 17 % (ref 12–46)
Lymphs Abs: 1.8 10*3/uL (ref 0.7–4.0)
MCH: 27.1 pg (ref 26.0–34.0)
MCHC: 34.6 g/dL (ref 30.0–36.0)
MCV: 78.2 fL (ref 78.0–100.0)
Monocytes Absolute: 0.6 10*3/uL (ref 0.1–1.0)
Monocytes Relative: 6 % (ref 3–12)
Neutro Abs: 8.1 10*3/uL — ABNORMAL HIGH (ref 1.7–7.7)
Neutrophils Relative %: 76 % (ref 43–77)
Platelets: 268 10*3/uL (ref 150–400)
RBC: 4.58 MIL/uL (ref 3.87–5.11)
RDW: 14.9 % (ref 11.5–15.5)
WBC: 10.7 10*3/uL — ABNORMAL HIGH (ref 4.0–10.5)

## 2013-05-28 LAB — BASIC METABOLIC PANEL
BUN: 11 mg/dL (ref 6–23)
CO2: 27 mEq/L (ref 19–32)
Calcium: 8.9 mg/dL (ref 8.4–10.5)
Chloride: 94 mEq/L — ABNORMAL LOW (ref 96–112)
Creatinine, Ser: 0.71 mg/dL (ref 0.50–1.10)
GFR calc Af Amer: >90 mL/min (ref >90)
GFR calc non Af Amer: 89 mL/min — ABNORMAL LOW (ref >90)
Glucose, Bld: 101 mg/dL — ABNORMAL HIGH (ref 70–99)
Potassium: 3.4 mEq/L — ABNORMAL LOW (ref 3.5–5.1)
Sodium: 131 mEq/L — ABNORMAL LOW (ref 135–145)

## 2013-05-28 LAB — TROPONIN I: Troponin I: <0.3 ng/mL (ref <0.30)

## 2013-05-28 LAB — POCT I-STAT TROPONIN I

## 2013-05-28 MED ORDER — ACETAMINOPHEN 325 MG PO TABS
650.0000 mg | ORAL_TABLET | Freq: Once | ORAL | Status: AC
  Administered 2013-05-28: 650 mg via ORAL
  Filled 2013-05-28: qty 2

## 2013-05-28 NOTE — ED Notes (Signed)
Pt states she woke up this morning not feeling well, pt went out with friends to lunch and shopping. Pt states while she was out shopping she got lightheaded and dizziness and almost passed out. Pt states she also had some epigastric pressure which she rated 4/10, pt was given 1 nitro, 324 ASA, and 4mg  Zofran.

## 2013-05-28 NOTE — ED Notes (Signed)
Dr Pickering at bedside 

## 2013-05-28 NOTE — ED Notes (Signed)
Patient transported to X-ray 

## 2013-05-28 NOTE — ED Notes (Signed)
Pt visibly upset and teary eyed.

## 2013-05-28 NOTE — ED Notes (Signed)
Discharge instructions reviewed with pt. Pt verbalized understanding.   

## 2013-05-28 NOTE — ED Provider Notes (Signed)
CSN: [id number]     Arrival date & time 05/28/13  1518 History   First MD Initiated Contact with Patient 05/28/13 1602     Chief Complaint  Patient presents with  . Chest Pain   (Consider location/radiation/quality/duration/timing/severity/associated sxs/prior Treatment) Patient is a 67 y.o. female presenting with chest pain. The history is provided by the patient.  Chest Pain Associated symptoms: abdominal pain, headache, nausea and vomiting   Associated symptoms: no back pain, no numbness, no shortness of breath and no weakness    patient presented to the ED after near syncopal episode. States that she been out of the store and began to feel nauseous and lightheaded. She went outside and throughout. She felt bad for a little while. She also has had a throbbing headache. She had some upper abdominal pain. No chest pain. No trouble breathing. No numbness or weakness. She states she's had episodes like this previously. She states she feels back to normal except for a headache.  Past Medical History  Diagnosis Date  . Hypertension   . Arthritis    Past Surgical History  Procedure Laterality Date  . Back surgery     History reviewed. No pertinent family history. History  Substance Use Topics  . Smoking status: Never Smoker   . Smokeless tobacco: Not on file  . Alcohol Use: Yes     Comment: social drinker   OB History   Grav Para Term Preterm Abortions TAB SAB Ect Mult Living                 Review of Systems  Constitutional: Negative for activity change and appetite change.  Eyes: Negative for pain.  Respiratory: Negative for chest tightness and shortness of breath.   Cardiovascular: Positive for chest pain. Negative for leg swelling.  Gastrointestinal: Positive for nausea, vomiting and abdominal pain. Negative for diarrhea.  Genitourinary: Negative for flank pain.  Musculoskeletal: Negative for back pain and neck stiffness.  Skin: Negative for rash.  Neurological: Positive  for light-headedness and headaches. Negative for weakness and numbness.  Psychiatric/Behavioral: Negative for behavioral problems.    Allergies  Review of patient's allergies indicates no known allergies.  Home Medications   Current Outpatient Rx  Name  Route  Sig  Dispense  Refill  . Bioflavonoid Products (ESTER C PO)   Oral   Take 1 tablet by mouth daily.         . carvedilol (COREG) 12.5 MG tablet   Oral   Take 12.5 mg by mouth 2 (two) times daily with a meal.         . DULoxetine (CYMBALTA) 60 MG capsule   Oral   Take 60 mg by mouth daily.         Marland Kitchen esomeprazole (NEXIUM) 40 MG capsule   Oral   Take 40 mg by mouth daily at 12 noon.         . Estradiol Acetate (FEMRING) 0.05 MG/24HR RING   Vaginal   Place 1 each vaginally every 3 (three) months.         Marland Kitchen ibuprofen (ADVIL,MOTRIN) 200 MG tablet   Oral   Take 200 mg by mouth every 4 (four) hours as needed for mild pain.         . LevOCARNitine (L-CARNITINE) 500 MG TABS   Oral   Take 2 tablets by mouth daily.          BP 121/72  Pulse 67  Temp(Src) 97.9 F (36.6 C) (Oral)  Resp 13  SpO2 100% Physical Exam  Nursing note and vitals reviewed. Constitutional: She is oriented to person, place, and time. She appears well-developed and well-nourished.  HENT:  Head: Normocephalic and atraumatic.  Eyes: EOM are normal. Pupils are equal, round, and reactive to light.  Neck: Normal range of motion. Neck supple.  Cardiovascular: Normal rate, regular rhythm and normal heart sounds.   No murmur heard. Pulmonary/Chest: Effort normal and breath sounds normal. No respiratory distress. She has no wheezes. She has no rales.  Abdominal: Soft. Bowel sounds are normal. She exhibits no distension. There is no tenderness. There is no rebound and no guarding.  Musculoskeletal: Normal range of motion.  Neurological: She is alert and oriented to person, place, and time. No cranial nerve deficit.  Skin: Skin is warm and  dry.  Psychiatric: She has a normal mood and affect. Her speech is normal.    ED Course  Procedures (including critical care time) Labs Review Labs Reviewed  CBC WITH DIFFERENTIAL - Abnormal; Notable for the following:    WBC 10.7 (*)    HCT 35.8 (*)    Neutro Abs 8.1 (*)    All other components within normal limits  BASIC METABOLIC PANEL - Abnormal; Notable for the following:    Sodium 131 (*)    Potassium 3.4 (*)    Chloride 94 (*)    Glucose, Bld 101 (*)    GFR calc non Af Amer 89 (*)    All other components within normal limits  TROPONIN I  POCT I-STAT TROPONIN I   Imaging Review Dg Chest 2 View  05/28/2013   CLINICAL DATA:  Dizziness.  Blurred vision.  EXAM: CHEST  2 VIEW  COMPARISON:  None.  FINDINGS: Mediastinum and hilar structures are normal. Lungs are clear. Heart size normal. Apical pleural thickening consistent with scarring. Prior lumbar spine fusion. Degenerative changes thoracic spine. Prior cholecystectomy.  IMPRESSION: No active cardiopulmonary disease.   Electronically Signed   By: Maisie Fus  Register   On: 05/28/2013 16:48    EKG Interpretation    Date/Time:  Monday May 28 2013 15:31:42 EST Ventricular Rate:  66 PR Interval:  178 QRS Duration: 97 QT Interval:  454 QTC Calculation: 476 R Axis:   35 Text Interpretation:  Sinus rhythm Probable left atrial enlargement Abnormal R-wave progression, early transition Baseline wander in lead(s) II III aVF Confirmed by Alayne Estrella  MD, Tyshae Stair ([id number]) on 05/28/2013 6:34:56 PM            MDM   1. Near syncope   2. Hyponatremia   3. Hypokalemia    Patient with nausea vomiting and near syncope. Patient feels better now. She is not orthostatic. Laboratory reassuring except for mild hyponatremia and hypokalemia. Patient's tolerate orals will be discharged followup with her PCP.    Juliet Rude. Rubin Payor, MD 05/28/13 2306

## 2013-10-11 ENCOUNTER — Other Ambulatory Visit: Payer: Self-pay

## 2013-10-11 DIAGNOSIS — Z1231 Encounter for screening mammogram for malignant neoplasm of breast: Secondary | ICD-10-CM

## 2013-11-15 ENCOUNTER — Ambulatory Visit
Admission: RE | Admit: 2013-11-15 | Discharge: 2013-11-15 | Disposition: A | Discharge status: Home / Self Care | Payer: Medicare Other | Source: Ambulatory Visit

## 2013-11-15 ENCOUNTER — Encounter (INDEPENDENT_AMBULATORY_CARE_PROVIDER_SITE_OTHER): Payer: Self-pay

## 2013-11-15 DIAGNOSIS — Z1231 Encounter for screening mammogram for malignant neoplasm of breast: Secondary | ICD-10-CM

## 2014-01-17 ENCOUNTER — Other Ambulatory Visit: Payer: Self-pay | Admitting: Orthopaedic Surgery

## 2014-01-17 DIAGNOSIS — M542 Cervicalgia: Secondary | ICD-10-CM

## 2014-01-26 ENCOUNTER — Other Ambulatory Visit: Payer: Medicare Other

## 2014-01-27 ENCOUNTER — Ambulatory Visit
Admission: RE | Admit: 2014-01-27 | Discharge: 2014-01-27 | Disposition: A | Discharge status: Home / Self Care | Payer: Medicare Other | Source: Ambulatory Visit | Attending: Orthopaedic Surgery | Admitting: Orthopaedic Surgery

## 2014-01-27 DIAGNOSIS — M542 Cervicalgia: Secondary | ICD-10-CM

## 2014-10-18 ENCOUNTER — Other Ambulatory Visit: Payer: Self-pay

## 2014-10-18 DIAGNOSIS — Z1231 Encounter for screening mammogram for malignant neoplasm of breast: Secondary | ICD-10-CM

## 2014-11-22 ENCOUNTER — Ambulatory Visit
Admission: RE | Admit: 2014-11-22 | Discharge: 2014-11-22 | Disposition: A | Discharge status: Home / Self Care | Payer: Medicare Other | Source: Ambulatory Visit

## 2014-11-22 ENCOUNTER — Ambulatory Visit: Payer: Self-pay

## 2014-11-22 DIAGNOSIS — Z1231 Encounter for screening mammogram for malignant neoplasm of breast: Secondary | ICD-10-CM

## 2015-11-07 ENCOUNTER — Other Ambulatory Visit: Payer: Self-pay

## 2015-11-07 DIAGNOSIS — Z1231 Encounter for screening mammogram for malignant neoplasm of breast: Secondary | ICD-10-CM

## 2015-11-25 ENCOUNTER — Ambulatory Visit
Admission: RE | Admit: 2015-11-25 | Discharge: 2015-11-25 | Disposition: A | Discharge status: Home / Self Care | Payer: Medicare Other | Source: Ambulatory Visit

## 2015-11-25 DIAGNOSIS — Z1231 Encounter for screening mammogram for malignant neoplasm of breast: Secondary | ICD-10-CM

## 2016-11-05 ENCOUNTER — Other Ambulatory Visit: Payer: Self-pay | Admitting: Internal Medicine

## 2016-11-05 DIAGNOSIS — Z1231 Encounter for screening mammogram for malignant neoplasm of breast: Secondary | ICD-10-CM

## 2016-12-07 ENCOUNTER — Ambulatory Visit: Payer: Medicare Other

## 2016-12-15 DIAGNOSIS — Z860101 Personal history of adenomatous and serrated colon polyps: Secondary | ICD-10-CM

## 2016-12-15 HISTORY — DX: Personal history of adenomatous and serrated colon polyps: Z86.0101

## 2016-12-16 ENCOUNTER — Ambulatory Visit
Admission: RE | Admit: 2016-12-16 | Discharge: 2016-12-16 | Disposition: A | Discharge status: Home / Self Care | Payer: Medicare Other | Source: Ambulatory Visit | Attending: Internal Medicine | Admitting: Internal Medicine

## 2016-12-16 DIAGNOSIS — Z1231 Encounter for screening mammogram for malignant neoplasm of breast: Secondary | ICD-10-CM

## 2017-12-20 ENCOUNTER — Other Ambulatory Visit: Payer: Self-pay | Admitting: Internal Medicine

## 2017-12-20 DIAGNOSIS — Z1231 Encounter for screening mammogram for malignant neoplasm of breast: Secondary | ICD-10-CM

## 2018-01-11 ENCOUNTER — Ambulatory Visit
Admission: RE | Admit: 2018-01-11 | Discharge: 2018-01-11 | Disposition: A | Discharge status: Home / Self Care | Payer: Medicare Other | Source: Ambulatory Visit | Attending: Internal Medicine | Admitting: Internal Medicine

## 2018-01-11 DIAGNOSIS — Z1231 Encounter for screening mammogram for malignant neoplasm of breast: Secondary | ICD-10-CM

## 2018-01-12 ENCOUNTER — Other Ambulatory Visit: Payer: Self-pay | Admitting: Internal Medicine

## 2018-01-12 DIAGNOSIS — R928 Other abnormal and inconclusive findings on diagnostic imaging of breast: Secondary | ICD-10-CM

## 2018-01-16 ENCOUNTER — Ambulatory Visit
Admission: RE | Admit: 2018-01-16 | Discharge: 2018-01-16 | Disposition: A | Discharge status: Home / Self Care | Payer: Medicare Other | Source: Ambulatory Visit

## 2018-01-16 ENCOUNTER — Other Ambulatory Visit: Payer: Self-pay | Admitting: Internal Medicine

## 2018-01-16 ENCOUNTER — Other Ambulatory Visit: Payer: Medicare Other

## 2018-01-16 ENCOUNTER — Ambulatory Visit
Admission: RE | Admit: 2018-01-16 | Discharge: 2018-01-16 | Disposition: A | Discharge status: Home / Self Care | Payer: Medicare Other | Source: Ambulatory Visit | Attending: Internal Medicine | Admitting: Internal Medicine

## 2018-01-16 DIAGNOSIS — R928 Other abnormal and inconclusive findings on diagnostic imaging of breast: Secondary | ICD-10-CM

## 2018-01-16 DIAGNOSIS — N6320 Unspecified lump in the left breast, unspecified quadrant: Secondary | ICD-10-CM

## 2018-01-16 HISTORY — PX: BREAST BIOPSY: SHX20

## 2018-01-17 ENCOUNTER — Telehealth: Payer: Self-pay | Admitting: Hematology

## 2018-01-17 NOTE — Telephone Encounter (Signed)
Spoke with patient to confirm afternoon [facility] appointment for 7/24, packet mailed to patient

## 2018-01-18 ENCOUNTER — Encounter: Payer: Self-pay | Admitting: *Deleted

## 2018-01-24 ENCOUNTER — Other Ambulatory Visit: Payer: Self-pay | Admitting: *Deleted

## 2018-01-24 DIAGNOSIS — Z170 Estrogen receptor positive status [ER+]: Principal | ICD-10-CM

## 2018-01-24 DIAGNOSIS — C50312 Malignant neoplasm of lower-inner quadrant of left female breast: Secondary | ICD-10-CM | POA: Insufficient documentation

## 2018-01-24 NOTE — Progress Notes (Signed)
[facility]  [phone] Fax:([phone]) [place] Note   Patient Care Team: Burnard Bunting, MD as PCP - General (Internal Medicine) Gery Pray, MD as Consulting Physician (Radiation Oncology) Truitt Merle, MD as Consulting Physician (Hematology) Stark Klein, MD as Consulting Physician (General Surgery)   Date of Service:  01/25/2018  CHIEF COMPLAINTS/PURPOSE OF CONSULTATION:  Malignant neoplasm of lower-inner quadrant of left breast    Oncology History   Cancer Staging Malignant neoplasm of lower-inner quadrant of left breast in female, estrogen receptor positive ([place]) Staging form: Breast, AJCC 8th Edition - Clinical stage from 01/16/2018: Stage IA (cT1b, cN0, cM0, G1, ER+, PR+, HER2-) - Signed by Truitt Merle, MD on 01/25/2018       Malignant neoplasm of lower-inner quadrant of left breast in female, estrogen receptor positive ([place])   01/16/2018 Mammogram    Diagnostic Mammogram of left breast 01/16/18 IMPRESSION: 1.  There is a suspicious mass measuring 7x6x7 mm in the left breast at 8 o'clock position 1 cm from the nipple. 2.  No evidence of left axillary lymphadenopathy. RECOMMENDATION: Ultrasound guided biopsy is recommended for the left breast mass.       01/16/2018 Initial Biopsy    Diagnosis 01/16/18 Breast, left, needle core biopsy, 8 o'clock - INVASIVE DUCTAL CARCINOMA. - DUCTAL CARCINOMA IN SITU.       01/16/2018 Receptors her2    Estrogen Receptor: 100%, POSITIVE, STRONG STAINING INTENSITY Progesterone Receptor: 90%, POSITIVE, STRONG STAINING INTENSITY Proliferation Marker Ki67: 10% HER2 - NEGATIVE      01/16/2018 Cancer Staging    Staging form: Breast, AJCC 8th Edition - Clinical stage from 01/16/2018: Stage IA (cT1b, cN0, cM0, G1, ER+, PR+, HER2-) - Signed by Truitt Merle, MD on 01/25/2018      01/24/2018 Initial Diagnosis    Malignant neoplasm of lower-inner quadrant of left breast in female, estrogen receptor  positive ([place])       HISTORY OF PRESENTING ILLNESS: [name] 72 y.o. female is a here because of newly diagnosed breast cancer. The patient presents to the Breast Clinic today accompanied by her husband.  Her left breast mass was found by screening mammogram. She has one every year and this is her first abnormal mammogram. She notes she feels no different or noticed any changes. She is overall at baseline.   Socially she is married and retired. She had 3 kids, 1 of which died from leukemia at 72yo. She is left with 2 sons.   She has a PMHx of several back and neck surgeries. She has arthritis and HTN and depression which she is on medication for. She tries to be very active.She notes multiple family members had breast cancer. She drinks occasionally and is a non-smoker.    GYN HISTORY  Menarchal: 72yo LMP: 72yo due to complete hysterectomy  Contraceptive: yes, for 2 years in the past HRT: Took for 10 years and stopped in 09-06-94 G3P3: 2 sons, 1 child died young    MEDICAL HISTORY:  Past Medical History:  Diagnosis Date  . Anxiety   . Arthritis   . Depression   . Hypertension     SURGICAL HISTORY: Past Surgical History:  Procedure Laterality Date  . BACK SURGERY    . BREAST BIOPSY Left 03/10/2010   benign  . CHOLECYSTECTOMY    . HYSTERECTOMY ABDOMINAL WITH SALPINGECTOMY    . NECK SURGERY    . TONSILLECTOMY      SOCIAL HISTORY: Social History   Socioeconomic  History  . Marital status: Married    Spouse name: Not on file  . Number of children: Not on file  . Years of education: Not on file  . Highest education level: Not on file  Occupational History  . Not on file  Social Needs  . Financial resource strain: Not on file  . Food insecurity:    Worry: Not on file    Inability: Not on file  . Transportation needs:    Medical: Not on file    Non-medical: Not on file  Tobacco Use  . Smoking status: Never Smoker  . Smokeless tobacco: Never Used    Substance and Sexual Activity  . Alcohol use: Yes    Comment: social drinker  . Drug use: No  . Sexual activity: Not on file  Lifestyle  . Physical activity:    Days per week: Not on file    Minutes per session: Not on file  . Stress: Not on file  Relationships  . Social connections:    Talks on phone: Not on file    Gets together: Not on file    Attends religious service: Not on file    Active member of club or organization: Not on file    Attends meetings of clubs or organizations: Not on file    Relationship status: Not on file  . Intimate partner violence:    Fear of current or ex partner: Not on file    Emotionally abused: Not on file    Physically abused: Not on file    Forced sexual activity: Not on file  Other Topics Concern  . Not on file  Social History Narrative  . Not on file    FAMILY HISTORY: Family History  Problem Relation Age of Onset  . Breast cancer Mother 66       deceased @ 67  . Breast cancer Maternal Aunt 50       deceased @ 106  . Breast cancer Maternal Aunt 50       deceased @ 73  . Breast cancer Maternal Aunt 50       deceased @ 96  . Leukemia Son 12       deceased @ 64  . Leukemia Father   . Stomach cancer Maternal Grandmother   . Leukemia Maternal Uncle   . Colon cancer Paternal Uncle   . Pancreatic cancer Paternal Uncle     ALLERGIES:  has No Known Allergies.  MEDICATIONS:  Current Outpatient Medications  Medication Sig Dispense Refill  . Bioflavonoid Products (ESTER C PO) Take 1 tablet by mouth daily.    . carvedilol (COREG) 12.5 MG tablet Take 12.5 mg by mouth 2 (two) times daily with a meal.    . DULoxetine (CYMBALTA) 60 MG capsule Take 60 mg by mouth daily.    Marland Kitchen esomeprazole (NEXIUM) 40 MG capsule Take 40 mg by mouth daily at 12 noon.    Marland Kitchen ibuprofen (ADVIL,MOTRIN) 200 MG tablet Take 200 mg by mouth every 4 (four) hours as needed for mild pain.    Marland Kitchen LORazepam (ATIVAN) 1 MG tablet Take 1 mg by mouth every 8 (eight) hours.     . meloxicam (MOBIC) 15 MG tablet Take 15 mg by mouth daily.    Marland Kitchen olmesartan-hydrochlorothiazide (BENICAR HCT) 40-25 MG tablet Take 1 tablet by mouth daily.    . traZODone (DESYREL) 50 MG tablet Take 50 mg by mouth at bedtime.    . ARIPiprazole (ABILIFY) 5 MG tablet Take  5 mg by mouth every morning.     No current facility-administered medications for this visit.     REVIEW OF SYSTEMS:   Constitutional: Denies fevers, chills or abnormal night sweats Eyes: Denies blurriness of vision, double vision or watery eyes Ears, nose, mouth, throat, and face: Denies mucositis or sore throat Respiratory: Denies cough, dyspnea or wheezes Cardiovascular: Denies palpitation, chest discomfort or lower extremity swelling Gastrointestinal:  Denies nausea, heartburn or change in bowel habits Skin: Denies abnormal skin rashes Lymphatics: Denies new lymphadenopathy or easy bruising Neurological:Denies numbness, tingling or new weaknesses Behavioral/Psych: Mood is stable, no new changes  All other systems were reviewed with the patient and are negative.  PHYSICAL EXAMINATION: ECOG PERFORMANCE STATUS: 0 - Asymptomatic  Vitals:   01/25/18 1250  BP: (!) 135/94  Pulse: 77  Resp: 17  Temp: 97.9 F (36.6 C)  SpO2: 97%   Filed Weights   01/25/18 1250  Weight: 155 lb 11.2 oz (70.6 kg)    GENERAL:alert, no distress and comfortable SKIN: skin color, texture, turgor are normal, no rashes or significant lesions EYES: normal, conjunctiva are pink and non-injected, sclera clear OROPHARYNX:no exudate, no erythema and lips, buccal mucosa, and tongue normal  NECK: supple, thyroid normal size, non-tender, without nodularity LYMPH:  no palpable lymphadenopathy in the cervical, axillary or inguinal LUNGS: clear to auscultation and percussion with normal breathing effort HEART: regular rate & rhythm and no murmurs and no lower extremity edema ABDOMEN:abdomen soft, non-tender and normal bowel  sounds Musculoskeletal:no cyanosis of digits and no clubbing  PSYCH: alert & oriented x 3 with fluent speech NEURO: no focal motor/sensory deficits BREAST: Breasts: Breast inspection showed them to be symmetrical with no nipple discharge. (+) significant skin ecchymosis at biopsy site with lumpy breast tissue (+) 0.8cm nodule at 6:00 position of left breast, likely related to biopsy. No other palpable mass or adenopathy.   LABORATORY DATA:  I have reviewed the data as listed CBC Latest Ref Rng & Units 01/25/2018 05/28/2013 01/28/2008  WBC 3.9 - 10.3 K/uL 8.6 10.7(H) 13.1(H)  Hemoglobin 11.6 - 15.9 g/dL 13.5 12.4 13.2  Hematocrit 34.8 - 46.6 % 39.0 35.8(L) 39.0  Platelets 145 - 400 K/uL 264 268 370    CMP Latest Ref Rng & Units 01/25/2018 05/28/2013 01/26/2008  Glucose 70 - 99 mg/dL 75 101(H) 96  BUN 8 - 23 mg/dL _0 Creatinine 0.44 - 1.00 mg/dL 0.86 0.71 1.0  Sodium 135 - 145 mmol/L 136 131(L) 137  Potassium 3.5 - 5.1 mmol/L 3.2(L) 3.4(L) 3.1(L)  Chloride 98 - 111 mmol/L 95(L) 94(L) 99  CO2 22 - 32 mmol/L 30 27 -  Calcium 8.9 - 10.3 mg/dL 9.1 8.9 -  Total Protein 6.5 - 8.1 g/dL 6.6 - -  Total Bilirubin 0.3 - 1.2 mg/dL 0.3 - -  Alkaline Phos 38 - 126 U/L 84 - -  AST 15 - 41 U/L 16 - -  ALT 0 - 44 U/L 10 - -     PATHOLOGY  Diagnosis 01/16/18 Breast, left, needle core biopsy, 8 o'clock - INVASIVE DUCTAL CARCINOMA. - DUCTAL CARCINOMA IN SITU. - SEE COMMENT. Microscopic Comment The carcinoma appears grade I. A breast prognostic profile will be performed and the results reported separately. The results are called to [facility] on 01/17/18. (JBK:gt, 01/17/18) PROGNOSTIC INDICATORS Results: IMMUNOHISTOCHEMICAL AND MORPHOMETRIC ANALYSIS PERFORMED MANUALLY Estrogen Receptor: 100%, POSITIVE, STRONG STAINING INTENSITY Progesterone Receptor: 90%, POSITIVE, STRONG STAINING INTENSITY Proliferation Marker Ki67: 10% REFERENCE  RANGE ESTROGEN RECEPTOR NEGATIVE  0% POSITIVE =>1% REFERENCE RANGE PROGESTERONE RECEPTOR NEGATIVE 0% POSITIVE =>1% All controls stained appropriately Enid Cutter MD Pathologist, Electronic Signature ( Signed 01/19/2018) FLUORESCENCE IN-SITU HYBRIDIZATION Results: HER2 - NEGATIVE RATIO OF HER2/CEP17 SIGNALS 1.21 AVERAGE HER2 COPY NUMBER PER CELL 2.35   RADIOGRAPHIC STUDIES: I have personally reviewed the radiological images as listed and agreed with the findings in the report. US Breast Ltd Uni Left Inc Axilla  Result Date: 01/16/2018 CLINICAL DATA:  Screening recall for a left breast distortion. EXAM: DIGITAL DIAGNOSTIC UNILATERAL LEFT MAMMOGRAM WITH CAD AND TOMO LEFT BREAST ULTRASOUND COMPARISON:  Previous exam(s). ACR Breast Density Category c: The breast tissue is heterogeneously dense, which may obscure small masses. FINDINGS: There is a persistent distortion in the lower-inner quadrant of the left breast, middle to anterior depth. Mammographic images were processed with CAD. Ultrasound targeted to the left breast at 8 o'clock, 1 cm from the nipple demonstrates an irregular hypoechoic mass with indistinct margins measuring 7 x 6 x 7 mm. Ultrasound of the left axilla demonstrates multiple normal-appearing lymph nodes. IMPRESSION: 1.  There is a suspicious mass in the left breast at 8 o'clock. 2.  No evidence of left axillary lymphadenopathy. RECOMMENDATION: Ultrasound guided biopsy is recommended for the left breast mass. This has been added on to today's interventional schedule at 3:45 p.m. I have discussed the findings and recommendations with the patient. Results were also provided in writing at the conclusion of the visit. If applicable, a reminder letter will be sent to the patient regarding the next appointment. BI-RADS CATEGORY  5: Highly suggestive of malignancy. Electronically Signed   By: Ammie Ferrier M.D.   On: 01/16/2018 15:26   Mm Diag Breast Tomo Uni Left  Result Date: 01/16/2018 CLINICAL DATA:   Screening recall for a left breast distortion. EXAM: DIGITAL DIAGNOSTIC UNILATERAL LEFT MAMMOGRAM WITH CAD AND TOMO LEFT BREAST ULTRASOUND COMPARISON:  Previous exam(s). ACR Breast Density Category c: The breast tissue is heterogeneously dense, which may obscure small masses. FINDINGS: There is a persistent distortion in the lower-inner quadrant of the left breast, middle to anterior depth. Mammographic images were processed with CAD. Ultrasound targeted to the left breast at 8 o'clock, 1 cm from the nipple demonstrates an irregular hypoechoic mass with indistinct margins measuring 7 x 6 x 7 mm. Ultrasound of the left axilla demonstrates multiple normal-appearing lymph nodes. IMPRESSION: 1.  There is a suspicious mass in the left breast at 8 o'clock. 2.  No evidence of left axillary lymphadenopathy. RECOMMENDATION: Ultrasound guided biopsy is recommended for the left breast mass. This has been added on to today's interventional schedule at 3:45 p.m. I have discussed the findings and recommendations with the patient. Results were also provided in writing at the conclusion of the visit. If applicable, a reminder letter will be sent to the patient regarding the next appointment. BI-RADS CATEGORY  5: Highly suggestive of malignancy. Electronically Signed   By: Ammie Ferrier M.D.   On: 01/16/2018 15:26   Mm 3d Screen Breast Bilateral  Result Date: 01/11/2018 CLINICAL DATA:  Screening. EXAM: DIGITAL SCREENING BILATERAL MAMMOGRAM WITH TOMO AND CAD COMPARISON:  Previous exam(s). ACR Breast Density Category c: The breast tissue is heterogeneously dense, which may obscure small masses. FINDINGS: In the left breast, possible distortion warrants further evaluation. In the right breast, no findings suspicious for malignancy. Images were processed with CAD. IMPRESSION: Further evaluation is suggested for possible distortion in the left breast. RECOMMENDATION: Diagnostic mammogram and possibly  ultrasound of the left  breast. (Code:[id number]-L-80M) The patient will be contacted regarding the findings, and additional imaging will be scheduled. BI-RADS CATEGORY  0: Incomplete. Need additional imaging evaluation and/or prior mammograms for comparison. Electronically Signed   By: Lajean Manes M.D.   On: 01/11/2018 13:01   Mm Clip Placement Left  Result Date: 01/16/2018 CLINICAL DATA:  Status post ultrasound-guided core biopsy of a left breast mass. EXAM: DIAGNOSTIC LEFT MAMMOGRAM POST ULTRASOUND BIOPSY COMPARISON:  Previous exam(s). FINDINGS: Mammographic images were obtained following ultrasound guided biopsy of a mass in the 8 o'clock region of the left breast. Mammographic images show there is a ribbon shaped clip in appropriate position in the mass/distortion in the 8 o'clock region of the left breast. IMPRESSION: Status post ultrasound-guided core biopsy of the left breast with pathology pending. Final Assessment: Post Procedure Mammograms for Marker Placement Electronically Signed   By: Lillia Mountain M.D.   On: 01/16/2018 16:22   [place] Lt Breast Bx W Loc Dev 1st Lesion Img Bx Spec US Guide  Addendum Date: 01/18/2018   ADDENDUM REPORT: 01/17/2018 15:21 ADDENDUM: Pathology revealed GRADE I INVASIVE DUCTAL CARCINOMA, DUCTAL CARCINOMA IN SITU of the Left breast, 8 o'clock. This was found to be concordant by Dr. Lillia Mountain. Pathology results were discussed with the patient by telephone. The patient reported doing well after the biopsy with tenderness at the site. Post biopsy instructions and care were reviewed and questions were answered. The patient was encouraged to call [facility] for any additional concerns. The patient was referred to [facility] on January 25, 2018. Pathology results reported by Terie Purser, RN on 01/17/2018. Electronically Signed   By: Lillia Mountain M.D.   On: 01/17/2018 15:21   Result Date: 01/18/2018 CLINICAL  DATA:  Left breast mass. EXAM: ULTRASOUND GUIDED LEFT BREAST CORE NEEDLE BIOPSY COMPARISON:  Previous exam(s). FINDINGS: I met with the patient and we discussed the procedure of ultrasound-guided biopsy, including benefits and alternatives. We discussed the high likelihood of a successful procedure. We discussed the risks of the procedure, including infection, bleeding, tissue injury, clip migration, and inadequate sampling. Informed written consent was given. The usual time-out protocol was performed immediately prior to the procedure. Lesion quadrant: Lower inner quadrant. Using sterile technique and 1% Lidocaine as local anesthetic, under direct ultrasound visualization, a 14 gauge spring-loaded device was used to perform biopsy of a mass in the 8 o'clock region of the left breast using a lateral to medial approach. At the conclusion of the procedure a ribbon shaped tissue marker clip was deployed into the biopsy cavity. Follow up 2 view mammogram was performed and dictated separately. IMPRESSION: Ultrasound guided biopsy of a mass in the 8 o'clock region of the left breast. No apparent complications. Electronically Signed: By: Lillia Mountain M.D. On: 01/16/2018 15:49    ASSESSMENT & PLAN:  [name] is a 72 y.o. Caucasian female with a history of arthritis and HTN.    1. 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 She has invasive ductal carcinoma with DCIS components.  -Giving the early stage disease, she likely need a lumpectomy with sentinel lymph node biopsy. She is agreeable with that. She was seen by Dr. Barry Dienes today and likely will proceed with surgery soon.  -  If her resected tumor is 1cm or larger I would recommend a Oncotype Dx test on the surgical sample and we'll make a decision about adjuvant chemotherapy based on the  Oncotype result. Written material of this test was given to her. She is older but fit, would be a candidate for chemotherapy if her Oncotype recurrence score is high. -The risk of recurrence depends on the stage and biology of the tumor. She is early stage, low grade with ER/PR positive and HER2 negative markers. I discussed this is the more common type of slow growing tumor.  -She was also seen by radiation oncologist Dr. Sondra Come today. Adjuvant radiation is recommended to reduce her risk for local recurrence.  -Giving the strong ER and PR expression in her postmenopausal status, I recommend adjuvant endocrine therapy with aromatase inhibitor for a total of 5-7 years to reduce the risk of cancer recurrence. Potential benefits and side effects were discussed with patient in detail and she is interested. -We also discussed the breast cancer surveillance after her surgery. She will continue annual screening mammogram, self exam, and a routine office visit with lab and exam with [place]. -I encouraged her to have healthy diet and exercise regularly -Lab reviewed, CBC and CMP WNL except her potasium at 3.2. I suggest she restart oral potassium and she increase potassium in her diet.    2. Genetic Testing -Given her strong family history of breast cancer she is eligible for genetic testing. She is interested.  -I will send referral for genetics.     PLAN:  -Send genetic referral  -she will proceed left breast lumpectomy and sentinel lymph node biopsy by Dr. Barry Dienes soon -if tumor >1cm will send Oncotype  -F/u afte radiation or sooner if needed    All questions were answered. The patient knows to call the clinic with any problems, questions or concerns. I spent 40 minutes counseling the patient face to face. The total time spent in the appointment was 45 minutes and more than 50% was on counseling.     Truitt Merle, MD 01/25/2018   I, Joslyn Devon, am acting as scribe for Truitt Merle, MD.   I have  reviewed the above documentation for accuracy and completeness, and I agree with the above.

## 2018-01-25 ENCOUNTER — Ambulatory Visit
Admission: RE | Admit: 2018-01-25 | Discharge: 2018-01-25 | Disposition: A | Discharge status: Home / Self Care | Payer: Medicare Other | Source: Ambulatory Visit | Attending: Radiation Oncology | Admitting: Radiation Oncology

## 2018-01-25 ENCOUNTER — Other Ambulatory Visit: Payer: Self-pay

## 2018-01-25 ENCOUNTER — Encounter: Payer: Self-pay | Admitting: Physical Therapy

## 2018-01-25 ENCOUNTER — Inpatient Hospital Stay: Payer: Medicare Other | Attending: Hematology | Admitting: Hematology

## 2018-01-25 ENCOUNTER — Inpatient Hospital Stay: Payer: Medicare Other

## 2018-01-25 ENCOUNTER — Other Ambulatory Visit: Payer: Self-pay | Admitting: *Deleted

## 2018-01-25 ENCOUNTER — Ambulatory Visit: Payer: Medicare Other | Attending: General Surgery | Admitting: Physical Therapy

## 2018-01-25 ENCOUNTER — Other Ambulatory Visit: Payer: Self-pay | Admitting: General Surgery

## 2018-01-25 ENCOUNTER — Encounter: Payer: Self-pay | Admitting: General Practice

## 2018-01-25 ENCOUNTER — Encounter: Payer: Self-pay | Admitting: Hematology

## 2018-01-25 VITALS — BP 135/94 | HR 77 | Temp 97.9°F | Resp 17 | Ht 64.5 in | Wt 155.7 lb

## 2018-01-25 DIAGNOSIS — Z170 Estrogen receptor positive status [ER+]: Principal | ICD-10-CM

## 2018-01-25 DIAGNOSIS — C50312 Malignant neoplasm of lower-inner quadrant of left female breast: Secondary | ICD-10-CM

## 2018-01-25 DIAGNOSIS — Z780 Asymptomatic menopausal state: Secondary | ICD-10-CM | POA: Insufficient documentation

## 2018-01-25 DIAGNOSIS — Z803 Family history of malignant neoplasm of breast: Secondary | ICD-10-CM

## 2018-01-25 DIAGNOSIS — R293 Abnormal posture: Secondary | ICD-10-CM | POA: Diagnosis present

## 2018-01-25 DIAGNOSIS — E876 Hypokalemia: Secondary | ICD-10-CM

## 2018-01-25 DIAGNOSIS — C50212 Malignant neoplasm of upper-inner quadrant of left female breast: Secondary | ICD-10-CM

## 2018-01-25 LAB — CMP (CANCER CENTER ONLY)
ALK PHOS: 84 U/L (ref 38–126)
ALT: 10 U/L (ref 0–44)
AST: 16 U/L (ref 15–41)
Albumin: 3.9 g/dL (ref 3.5–5.0)
Anion gap: 11 (ref 5–15)
BUN: 13 mg/dL (ref 8–23)
CALCIUM: 9.1 mg/dL (ref 8.9–10.3)
CO2: 30 mmol/L (ref 22–32)
CREATININE: 0.86 mg/dL (ref 0.44–1.00)
Chloride: 95 mmol/L — ABNORMAL LOW (ref 98–111)
Glucose, Bld: 75 mg/dL (ref 70–99)
Potassium: 3.2 mmol/L — ABNORMAL LOW (ref 3.5–5.1)
SODIUM: 136 mmol/L (ref 135–145)
Total Bilirubin: 0.3 mg/dL (ref 0.3–1.2)
Total Protein: 6.6 g/dL (ref 6.5–8.1)

## 2018-01-25 LAB — CBC WITH DIFFERENTIAL (CANCER CENTER ONLY)
BASOS PCT: 1 %
Basophils Absolute: 0.1 10*3/uL (ref 0.0–0.1)
EOS ABS: 0.1 10*3/uL (ref 0.0–0.5)
EOS PCT: 2 %
HCT: 39 % (ref 34.8–46.6)
HEMOGLOBIN: 13.5 g/dL (ref 11.6–15.9)
LYMPHS ABS: 1.8 10*3/uL (ref 0.9–3.3)
Lymphocytes Relative: 21 %
MCH: 27.3 pg (ref 25.1–34.0)
MCHC: 34.7 g/dL (ref 31.5–36.0)
MCV: 78.7 fL — ABNORMAL LOW (ref 79.5–101.0)
MONOS PCT: 8 %
Monocytes Absolute: 0.7 10*3/uL (ref 0.1–0.9)
NEUTROS PCT: 68 %
Neutro Abs: 5.9 10*3/uL (ref 1.5–6.5)
PLATELETS: 264 10*3/uL (ref 145–400)
RBC: 4.96 MIL/uL (ref 3.70–5.45)
RDW: 14.2 % (ref 11.2–14.5)
WBC Count: 8.6 10*3/uL (ref 3.9–10.3)

## 2018-01-25 NOTE — Patient Instructions (Signed)

## 2018-01-25 NOTE — Therapy (Signed)
[place], [place], [address] Phone: [phone]   Fax:  [phone]  Physical Therapy Evaluation  Patient Details  Name: [name] MRN: [id number] Date of Birth: 12-02-1945 Referring Provider: Dr. Stark Klein    Encounter Date: 01/25/2018  PT End of Session - 01/25/18 1534    Visit Number  1    Number of Visits  2    Date for PT Re-Evaluation  03/22/18    PT Start Time  1572    PT Stop Time  6203 Also saw pt from 1432-1450 for a total of 32 minutes    PT Time Calculation (min)  14 min    Activity Tolerance  Patient tolerated treatment well    Behavior During Therapy  [facility] for tasks assessed/performed       Past Medical History:  Diagnosis Date  . Anxiety   . Arthritis   . Depression   . Hypertension     Past Surgical History:  Procedure Laterality Date  . BACK SURGERY    . BREAST BIOPSY Left 03/10/2010   benign  . CHOLECYSTECTOMY    . HYSTERECTOMY ABDOMINAL WITH SALPINGECTOMY    . NECK SURGERY    . TONSILLECTOMY      There were no vitals filed for this visit.   Subjective Assessment - 01/25/18 1505    Subjective  Patient reports she is here today to be seen by her medical team for her newly diagnosed left breast cancer.    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 It measures 7 mm and is located in the lower inner quadrant. It is ER/PR positive and HER2 negative with a Ki67 of 10%.     Patient Stated Goals  Reduce lymphedema risk and learn post op shoulder ROM HEP    Currently in Pain?  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 Stark Klein     Onset  Date/Surgical Date  01/11/18    Hand Dominance  Right    Prior Therapy  none      Precautions   Precautions  Other (comment)    Precaution Comments  active cancer      Restrictions   Weight Bearing Restrictions  No      Balance Screen   Has the patient fallen in the past 6 months  No    Has the patient had a decrease in activity level because of a fear of falling?   No    Is the patient reluctant to leave their home because of a fear of falling?   No      Home Environment   Living Environment  Private residence    Living Arrangements  Spouse/significant other    Available Help at Discharge  Family      Prior Function   Level of [place]  Retired    Leisure  Walks 20 minutes 3x/week      Cognition   Overall Cognitive Status  Within Functional Limits for tasks assessed      Posture/Postural  Control   Posture/Postural Control  Postural limitations    Postural Limitations  Rounded Shoulders;Forward head      ROM / Strength   AROM / PROM / Strength  AROM;Strength      AROM   AROM Assessment Site  Shoulder;Cervical    Right/Left Shoulder  Right;Left    Right Shoulder Extension  39 Degrees    Right Shoulder Flexion  136 Degrees    Right Shoulder ABduction  152 Degrees    Right Shoulder Internal Rotation  70 Degrees    Right Shoulder External Rotation  75 Degrees    Left Shoulder Extension  37 Degrees    Left Shoulder Flexion  142 Degrees    Left Shoulder ABduction  162 Degrees    Left Shoulder Internal Rotation  73 Degrees    Left Shoulder External Rotation  84 Degrees    Cervical Flexion  WNL    Cervical Extension  WNL    Cervical - Right Side Bend  WNL    Cervical - Left Side Bend  WNL    Cervical - Right Rotation  WNL    Cervical - Left Rotation  WNL      Strength   Overall Strength  Within functional limits for tasks performed        LYMPHEDEMA/ONCOLOGY QUESTIONNAIRE - 01/25/18 1511      Type   Cancer Type  Left breast cancer       Lymphedema Assessments   Lymphedema Assessments  Upper extremities      Right Upper Extremity Lymphedema   10 cm Proximal to Olecranon Process  26.5 cm    Olecranon Process  23.5 cm    10 cm Proximal to Ulnar Styloid Process  21.3 cm    Just Proximal to Ulnar Styloid Process  15.1 cm    Across Hand at [employer]  17.3 cm    At [place] of 2nd Digit  6.3 cm      Left Upper Extremity Lymphedema   10 cm Proximal to Olecranon Process  26.5 cm    Olecranon Process  23 cm    10 cm Proximal to Ulnar Styloid Process  21 cm    Just Proximal to Ulnar Styloid Process  14.5 cm    Across Hand at [employer]  17.1 cm    At [address] of 2nd Digit  5.9 cm             Objective measurements completed on examination: See above findings.    Patient was instructed today in a home exercise program today for post op shoulder range of motion. These included active assist shoulder flexion in sitting, scapular retraction, wall walking with shoulder abduction, and hands behind head external rotation.  She was encouraged to do these twice a day, holding 3 seconds and repeating 5 times when permitted by her physician.      PT Education - 01/25/18 1533    Education Details  Lymphedema risk reduction and post op shoulder ROM HEP    Person(s) Educated  Patient;Spouse    Methods  Explanation;Demonstration;Handout    Comprehension  Returned demonstration;Verbalized understanding          PT Long Term Goals - 01/25/18 1538      PT LONG TERM GOAL #1   Title  Patient will demonstrate she has returned to baseline post operatively related to shoulder ROM and function.    Time  8    Period  Weeks  Status  New      Breast Clinic Goals - 01/25/18 1537      Patient will be able to verbalize understanding of pertinent lymphedema risk reduction practices relevant to her diagnosis specifically related to skin care.   Time  1    Period  Days    Status  Achieved      Patient will be able to  return demonstrate and/or verbalize understanding of the post-op home exercise program related to regaining shoulder range of motion.   Time  1    Period  Days    Status  Achieved      Patient will be able to verbalize understanding of the importance of attending the postoperative After Breast Cancer Class for further lymphedema risk reduction education and therapeutic exercise.   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 It measures 7 mm and is located in the lower inner quadrant. It is ER/PR positive and HER2 negative with a Ki67 of 10%. Her multidisciplinary medical team met prior to her assessments to determine a recommended treatment plan. She is planning to have a left lumpectomy and sentine node biopsy followed by Oncotype testing (if mass is > 1 cm), radiation, and anti-estrogen therapy. She will benefit from a post op PT visit to reassess and determine needs.    Clinical Presentation  Stable    Clinical Decision Making  Low    Rehab Potential  Excellent    Clinical Impairments Affecting Rehab Potential  None    PT Frequency  -- Eval and 1 f/u visit    PT Treatment/Interventions  ADLs/Self Care Home Management;Therapeutic exercise;Patient/family education    PT Next Visit Plan  Will reassess 3-4 weeks post op    PT Home Exercise Plan  Post op shoulder ROM HEP    Consulted and Agree with Plan of Care  Patient;Family member/caregiver    Family Member Consulted  Husband       Patient will benefit from skilled therapeutic intervention in order to improve the following deficits and impairments:  Decreased range of motion, Decreased knowledge of precautions, Impaired UE functional use, Postural dysfunction, Pain  Visit Diagnosis: Malignant neoplasm of lower-inner quadrant of left breast in female, estrogen receptor  positive ([place]) - Plan: PT plan of care cert/re-cert  Abnormal posture - Plan: PT plan of care cert/re-cert   Patient will follow up at outpatient cancer rehab 3-4 weeks following surgery.  If the patient requires physical therapy at that time, a specific plan will be dictated and sent to the referring physician for approval. The patient was educated today on appropriate basic range of motion exercises to begin post operatively and the importance of attending the After Breast Cancer class following surgery.  Patient was educated today on lymphedema risk reduction practices as it pertains to recommendations that will benefit the patient immediately following surgery.  She verbalized good understanding.      Problem List Patient Active Problem List   Diagnosis Date Noted  . Malignant neoplasm of lower-inner quadrant of left breast in female, estrogen receptor positive ([place]) 01/24/2018   Annia Friendly, PT 01/25/18 3:40 PM  [place], [place], [address] Phone: [phone]   Fax:  [phone]  Name: [name] MRN: [id number] Date of Birth: Jun 29, 1946

## 2018-01-25 NOTE — Progress Notes (Signed)
Radiation Oncology         [phone] ________________________________  Multidisciplinary Breast Oncology Clinic [facility]) Initial Outpatient Consultation  Name: [name] MRN: [id number]  Date: 01/25/2018  DOB: 06/21/1946  [id number], Delfino Lovett, MD  Stark Klein, MD   REFERRING PHYSICIAN: Stark Klein, MD  DIAGNOSIS: The encounter diagnosis was Malignant neoplasm of lower-inner quadrant of left breast in female, estrogen receptor positive ([place]).  Clinical Stage T1b,Nx,mx left Breast LIQ Invasive Ductal Carcinoma, ER+ / PR+ / Her2-, Grade 1    ICD-10-CM   1. Malignant neoplasm of lower-inner quadrant of left breast in female, estrogen receptor positive ([place]) C50.312    Z17.0     Cancer Staging Malignant neoplasm of lower-inner quadrant of left breast in female, estrogen receptor positive ([place]) Staging form: Breast, AJCC 8th Edition - Clinical stage from 01/16/2018: Stage IA (cT1b, cN0, cM0, G1, ER+, PR+, HER2-) - Signed by Truitt Merle, MD on 01/25/2018   HISTORY OF PRESENT ILLNESS::[name] is a 72 y.o. female who is presenting to the office today for evaluation of her newly diagnosed breast cancer. She is accompanied by her husband. She is doing well overall. She has an extensive family history of cancer, most importantly her mother had breast cancer at 59 and three of her maternal aunts had breast cancer when they were 6, 23 and 102.   She had routine screening bilateral mammography on 01/11/2018, breast density category C, showing further evaluation is suggested for possible distortion in the left breast.   On 01/16/2018 she underwent a Diagnostic unilateral left breast mammogram with CAD and TOMO and left breast ultrasound, breast density category C, showing a suspicious mass in the breast at 8 o'clock. No evidence of left axillary lymphadenopathy.   Accordingly on 01/16/2018 she proceeded to biopsy of the Left breast area in question. The pathology from this procedure  showed invasive ductal carcinoma and ductal carcinoma IN SITU. Prognostic indicators significant for: estrogen receptor, 100% positive, strong staining intensity; progesterone receptor, 90% positive,strong staining intensity; Proliferation marker Ki67 at 10%. HER2  Negative.   Menarche: 72 years old Age at first live birth: 72 years old GP: 3 LMP: 68  Contraceptive: yes, for about 2 years HRT: yes, for ten years, stopped in 1996  The patient was referred today for presentation in the multidisciplinary conference.  Radiology studies and pathology slides were presented there for review and discussion of treatment options.  A consensus was discussed regarding potential next steps.  PREVIOUS RADIATION THERAPY: No  PAST MEDICAL HISTORY:  has a past medical history of Anxiety, Arthritis, Depression, and Hypertension.    PAST SURGICAL HISTORY: Past Surgical History:  Procedure Laterality Date  . BACK SURGERY    . BREAST BIOPSY Left 03/10/2010   benign  . CHOLECYSTECTOMY    . HYSTERECTOMY ABDOMINAL WITH SALPINGECTOMY    . NECK SURGERY    . TONSILLECTOMY      FAMILY HISTORY: family history includes Breast cancer (age of onset: 79) in her maternal aunt, maternal aunt, and maternal aunt; Breast cancer (age of onset: 73) in her mother; Colon cancer in her paternal uncle; Leukemia in her father and maternal uncle; Leukemia (age of onset: 37) in her son; Pancreatic cancer in her paternal uncle; Stomach cancer in her maternal grandmother.  SOCIAL HISTORY:  reports that she has never smoked. She has never used smokeless tobacco. She reports that she drinks alcohol. She reports that she does not use drugs.  ALLERGIES: Patient has no known allergies.  MEDICATIONS:  Current Outpatient Medications  Medication Sig Dispense Refill  . ARIPiprazole (ABILIFY) 5 MG tablet Take 5 mg by mouth every morning.    Marland Kitchen Bioflavonoid Products (ESTER C PO) Take 1 tablet by mouth daily.    . carvedilol (COREG) 12.5  MG tablet Take 12.5 mg by mouth 2 (two) times daily with a meal.    . DULoxetine (CYMBALTA) 60 MG capsule Take 60 mg by mouth daily.    Marland Kitchen esomeprazole (NEXIUM) 40 MG capsule Take 40 mg by mouth daily at 12 noon.    Marland Kitchen ibuprofen (ADVIL,MOTRIN) 200 MG tablet Take 200 mg by mouth every 4 (four) hours as needed for mild pain.    Marland Kitchen LORazepam (ATIVAN) 1 MG tablet Take 1 mg by mouth every 8 (eight) hours.    . meloxicam (MOBIC) 15 MG tablet Take 15 mg by mouth daily.    Marland Kitchen olmesartan-hydrochlorothiazide (BENICAR HCT) 40-25 MG tablet Take 1 tablet by mouth daily.    . traZODone (DESYREL) 50 MG tablet Take 50 mg by mouth at bedtime.     No current facility-administered medications for this encounter.     REVIEW OF SYSTEMS:  REVIEW OF SYSTEMS: A 10+ POINT REVIEW OF SYSTEMS WAS OBTAINED including neurology, dermatology, psychiatry, cardiac, respiratory, lymph, extremities, GI, GU, musculoskeletal, constitutional, reproductive, HEENT. All pertinent positives are noted in the HPI. All others are negative.   PHYSICAL EXAM:  Vitals - 1 value per visit 8/46/6599  SYSTOLIC 357  DIASTOLIC 94  Pulse 77  Temperature 97.9  Respirations 17  Weight (lb) 155.7  Height 5' 4.5"  BMI 26.31  VISIT REPORT    Lungs are clear to auscultation bilaterally. Heart has regular rate and rhythm. No palpable cervical, supraclavicular, or axillary adenopathy. Abdomen soft, non-tender, normal bowel sounds.   Right breast no palpable mass, nipple discharge or bleeding. Right breast is smaller than the left, pt said it's been chronic. Left breast somewhat larger than the right. There is extensive bruising in the inferior aspect of the left breast. No palpable mass appreciated in light of extensive bruising. No nipple discharge or bleeding.    KPS = 90  100 - Normal; no complaints; no evidence of disease. 90   - Able to carry on normal activity; minor signs or symptoms of disease. 80   - Normal activity with effort; some signs  or symptoms of disease. 71   - Cares for self; unable to carry on normal activity or to do active work. 60   - Requires occasional assistance, but is able to care for most of his personal needs. 50   - Requires considerable assistance and frequent medical care. 65   - Disabled; requires special care and assistance. 81   - Severely disabled; hospital admission is indicated although death not imminent. 70   - Very sick; hospital admission necessary; active supportive treatment necessary. 10   - Moribund; fatal processes progressing rapidly. 0     - Dead  Karnofsky DA, [place], Craver LS and Burchenal [facility] [phone]) The use of the nitrogen mustards in the palliative treatment of carcinoma: with particular reference to bronchogenic carcinoma Cancer 1 634-56  LABORATORY DATA:  Lab Results  Component Value Date   WBC 8.6 01/25/2018   HGB 13.5 01/25/2018   HCT 39.0 01/25/2018   MCV 78.7 (L) 01/25/2018   PLT 264 01/25/2018   Lab Results  Component Value Date   NA 136 01/25/2018   K 3.2 (L) 01/25/2018   CL  95 (L) 01/25/2018   CO2 30 01/25/2018   Lab Results  Component Value Date   ALT 10 01/25/2018   AST 16 01/25/2018   ALKPHOS 84 01/25/2018   BILITOT 0.3 01/25/2018    RADIOGRAPHY: US Breast Ltd Uni Left Inc Axilla  Result Date: 01/16/2018 CLINICAL DATA:  Screening recall for a left breast distortion. EXAM: DIGITAL DIAGNOSTIC UNILATERAL LEFT MAMMOGRAM WITH CAD AND TOMO LEFT BREAST ULTRASOUND COMPARISON:  Previous exam(s). ACR Breast Density Category c: The breast tissue is heterogeneously dense, which may obscure small masses. FINDINGS: There is a persistent distortion in the lower-inner quadrant of the left breast, middle to anterior depth. Mammographic images were processed with CAD. Ultrasound targeted to the left breast at 8 o'clock, 1 cm from the nipple demonstrates an irregular hypoechoic mass with indistinct margins measuring 7 x 6 x 7 mm. Ultrasound of the left axilla demonstrates  multiple normal-appearing lymph nodes. IMPRESSION: 1.  There is a suspicious mass in the left breast at 8 o'clock. 2.  No evidence of left axillary lymphadenopathy. RECOMMENDATION: Ultrasound guided biopsy is recommended for the left breast mass. This has been added on to today's interventional schedule at 3:45 p.m. I have discussed the findings and recommendations with the patient. Results were also provided in writing at the conclusion of the visit. If applicable, a reminder letter will be sent to the patient regarding the next appointment. BI-RADS CATEGORY  5: Highly suggestive of malignancy. Electronically Signed   By: Ammie Ferrier M.D.   On: 01/16/2018 15:26   Mm Diag Breast Tomo Uni Left  Result Date: 01/16/2018 CLINICAL DATA:  Screening recall for a left breast distortion. EXAM: DIGITAL DIAGNOSTIC UNILATERAL LEFT MAMMOGRAM WITH CAD AND TOMO LEFT BREAST ULTRASOUND COMPARISON:  Previous exam(s). ACR Breast Density Category c: The breast tissue is heterogeneously dense, which may obscure small masses. FINDINGS: There is a persistent distortion in the lower-inner quadrant of the left breast, middle to anterior depth. Mammographic images were processed with CAD. Ultrasound targeted to the left breast at 8 o'clock, 1 cm from the nipple demonstrates an irregular hypoechoic mass with indistinct margins measuring 7 x 6 x 7 mm. Ultrasound of the left axilla demonstrates multiple normal-appearing lymph nodes. IMPRESSION: 1.  There is a suspicious mass in the left breast at 8 o'clock. 2.  No evidence of left axillary lymphadenopathy. RECOMMENDATION: Ultrasound guided biopsy is recommended for the left breast mass. This has been added on to today's interventional schedule at 3:45 p.m. I have discussed the findings and recommendations with the patient. Results were also provided in writing at the conclusion of the visit. If applicable, a reminder letter will be sent to the patient regarding the next appointment.  BI-RADS CATEGORY  5: Highly suggestive of malignancy. Electronically Signed   By: Ammie Ferrier M.D.   On: 01/16/2018 15:26   Mm 3d Screen Breast Bilateral  Result Date: 01/11/2018 CLINICAL DATA:  Screening. EXAM: DIGITAL SCREENING BILATERAL MAMMOGRAM WITH TOMO AND CAD COMPARISON:  Previous exam(s). ACR Breast Density Category c: The breast tissue is heterogeneously dense, which may obscure small masses. FINDINGS: In the left breast, possible distortion warrants further evaluation. In the right breast, no findings suspicious for malignancy. Images were processed with CAD. IMPRESSION: Further evaluation is suggested for possible distortion in the left breast. RECOMMENDATION: Diagnostic mammogram and possibly ultrasound of the left breast. (Code:[id number]) The patient will be contacted regarding the findings, and additional imaging will be scheduled. BI-RADS CATEGORY  0: Incomplete. Need additional imaging  evaluation and/or prior mammograms for comparison. Electronically Signed   By: Lajean Manes M.D.   On: 01/11/2018 13:01   Mm Clip Placement Left  Result Date: 01/16/2018 CLINICAL DATA:  Status post ultrasound-guided core biopsy of a left breast mass. EXAM: DIAGNOSTIC LEFT MAMMOGRAM POST ULTRASOUND BIOPSY COMPARISON:  Previous exam(s). FINDINGS: Mammographic images were obtained following ultrasound guided biopsy of a mass in the 8 o'clock region of the left breast. Mammographic images show there is a ribbon shaped clip in appropriate position in the mass/distortion in the 8 o'clock region of the left breast. IMPRESSION: Status post ultrasound-guided core biopsy of the left breast with pathology pending. Final Assessment: Post Procedure Mammograms for Marker Placement Electronically Signed   By: Lillia Mountain M.D.   On: 01/16/2018 16:22   [place] Lt Breast Bx W Loc Dev 1st Lesion Img Bx Spec US Guide  Addendum Date: 01/18/2018   ADDENDUM REPORT: 01/17/2018 15:21 ADDENDUM: Pathology revealed GRADE I INVASIVE  DUCTAL CARCINOMA, DUCTAL CARCINOMA IN SITU of the Left breast, 8 o'clock. This was found to be concordant by Dr. Lillia Mountain. Pathology results were discussed with the patient by telephone. The patient reported doing well after the biopsy with tenderness at the site. Post biopsy instructions and care were reviewed and questions were answered. The patient was encouraged to call [facility] for any additional concerns. The patient was referred to [facility] on January 25, 2018. Pathology results reported by Terie Purser, RN on 01/17/2018. Electronically Signed   By: Lillia Mountain M.D.   On: 01/17/2018 15:21   Result Date: 01/18/2018 CLINICAL DATA:  Left breast mass. EXAM: ULTRASOUND GUIDED LEFT BREAST CORE NEEDLE BIOPSY COMPARISON:  Previous exam(s). FINDINGS: I met with the patient and we discussed the procedure of ultrasound-guided biopsy, including benefits and alternatives. We discussed the high likelihood of a successful procedure. We discussed the risks of the procedure, including infection, bleeding, tissue injury, clip migration, and inadequate sampling. Informed written consent was given. The usual time-out protocol was performed immediately prior to the procedure. Lesion quadrant: Lower inner quadrant. Using sterile technique and 1% Lidocaine as local anesthetic, under direct ultrasound visualization, a 14 gauge spring-loaded device was used to perform biopsy of a mass in the 8 o'clock region of the left breast using a lateral to medial approach. At the conclusion of the procedure a ribbon shaped tissue marker clip was deployed into the biopsy cavity. Follow up 2 view mammogram was performed and dictated separately. IMPRESSION: Ultrasound guided biopsy of a mass in the 8 o'clock region of the left breast. No apparent complications. Electronically Signed: By: Lillia Mountain M.D. On: 01/16/2018 15:49        IMPRESSION:  Stage T1b,Nx,mx left Breast LIQ Invasive Ductal Carcinoma, ER+ / PR+ / Her2-, Grade 1  Patient will be a good candidate for breast conservation with radiotherapy to left breast. We discussed the general course of radiation, potential side effects, and toxicities with radiation and the patient is interested in this approach.    PLAN: She will be scheduled for genetic testing, MRI, and left lumpectomy sentinel lymph node biopsy. The patient will be seen in the postoperative setting for further evaluation and discussion of radiation therapy  She will be followed by Dr. Burr Medico for adjuvant hormonal therapy with aromatase inhibitors.    ------------------------------------------------  Blair Promise, PhD, MD   .This document serves as a record of services  personally performed by Gery Pray, MD. It was created on his behalf by Vanessa Ralphs, a trained medical scribe. The creation of this record is based on the scribe's personal observations and the provider's statements to them. This document has been checked and approved by the attending provider.

## 2018-01-26 ENCOUNTER — Telehealth: Payer: Self-pay | Admitting: Hematology

## 2018-01-26 NOTE — Telephone Encounter (Signed)
No los 7/24 °

## 2018-01-26 NOTE — Telephone Encounter (Signed)
No LOS 7/24

## 2018-01-26 NOTE — Progress Notes (Signed)
[place] Psychosocial Distress Screening Spiritual Care  Met with [name] and husband [name] in [facility] to introduce [place] team/resources, reviewing distress screen per protocol.  The patient scored a 1 on the Psychosocial Distress Thermometer which indicates mild distress. Also assessed for distress and other psychosocial needs.   ONCBCN DISTRESS SCREENING 01/26/2018  Screening Type Initial Screening  Distress experienced in past week (1-10) 1  Emotional problem type Nervousness/Anxiety;Adjusting to illness  Information Concerns Type Lack of info about diagnosis;Lack of info about treatment  Referral to support programs Yes   [name] reports good support from the [employer] [employer] [facility] [employer] congregation she is attending right next door to her home. Per pt, [facility] resolved her info concerns, and she feels very positive about her team. Couple has been through many challenges in their long marriage, including the death of one of their sons to leukemia at age 25. Per pt, she has worked internally to differentiate between his disease and hers, recognizing the very different situations and likely outcomes. [name] describes herself as an upbeat person who is feeling encouraged about her tx/px.   Follow up needed: No. Per couple, no other needs at this time, but they are aware of ongoing [place] team/programming needs as desired.   [place], [place], [facility] Pager [phone] Voicemail [phone]

## 2018-01-30 ENCOUNTER — Encounter: Payer: Self-pay | Admitting: Genetics

## 2018-01-30 ENCOUNTER — Inpatient Hospital Stay (HOSPITAL_BASED_OUTPATIENT_CLINIC_OR_DEPARTMENT_OTHER): Payer: Medicare Other | Admitting: Genetics

## 2018-01-30 ENCOUNTER — Inpatient Hospital Stay: Payer: Medicare Other

## 2018-01-30 DIAGNOSIS — Z806 Family history of leukemia: Secondary | ICD-10-CM | POA: Diagnosis not present

## 2018-01-30 DIAGNOSIS — Z803 Family history of malignant neoplasm of breast: Secondary | ICD-10-CM | POA: Diagnosis not present

## 2018-01-30 DIAGNOSIS — C50312 Malignant neoplasm of lower-inner quadrant of left female breast: Secondary | ICD-10-CM

## 2018-01-30 DIAGNOSIS — Z800 Family history of malignant neoplasm of digestive organs: Secondary | ICD-10-CM

## 2018-01-30 DIAGNOSIS — Z170 Estrogen receptor positive status [ER+]: Secondary | ICD-10-CM

## 2018-01-30 DIAGNOSIS — Z1379 Encounter for other screening for genetic and chromosomal anomalies: Secondary | ICD-10-CM

## 2018-01-30 NOTE — Progress Notes (Signed)
REFERRING PROVIDER: Truitt Merle, MD [id number] [place], [place] [address]  PRIMARY PROVIDER:  Burnard Bunting, MD  PRIMARY REASON FOR VISIT:  1. Malignant neoplasm of lower-inner quadrant of left breast in female, estrogen receptor positive ([place])   2. Family history of breast cancer   3. Family history of leukemia   4. Family history of colon cancer     HISTORY OF PRESENT ILLNESS:   [name], a 72 y.o. female, was seen for a [place] cancer genetics consultation at the request of Dr. Reynaldo Minium due to a personal and family history of cancer.  [name] presents to clinic today to discuss the possibility of a hereditary predisposition to cancer, genetic testing, and to further clarify her future cancer risks, as well as potential cancer risks for family members.   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 She is planning on undergoing left lumpectomy and will follow-up with radiation after surgery to discuss radiation therapy and will have antiestrogen therapy.   CANCER HISTORY:  Oncology History   Cancer Staging Malignant neoplasm of lower-inner quadrant of left breast in female, estrogen receptor positive ([place]) Staging form: Breast, AJCC 8th Edition - Clinical stage from 01/16/2018: Stage IA (cT1b, cN0, cM0, G1, ER+, PR+, HER2-) - Signed by Truitt Merle, MD on 01/25/2018       Malignant neoplasm of lower-inner quadrant of left breast in female, estrogen receptor positive ([place])   01/16/2018 Mammogram    Diagnostic Mammogram of left breast 01/16/18 IMPRESSION: 1.  There is a suspicious mass measuring 7x6x7 mm in the left breast at 8 o'clock position 1 cm from the nipple. 2.  No evidence of left axillary lymphadenopathy. RECOMMENDATION: Ultrasound guided biopsy is recommended for the left breast mass.       01/16/2018 Initial Biopsy    Diagnosis 01/16/18 Breast, left, needle core biopsy, 8  o'clock - INVASIVE DUCTAL CARCINOMA. - DUCTAL CARCINOMA IN SITU.       01/16/2018 Receptors her2    Estrogen Receptor: 100%, POSITIVE, STRONG STAINING INTENSITY Progesterone Receptor: 90%, POSITIVE, STRONG STAINING INTENSITY Proliferation Marker Ki67: 10% HER2 - NEGATIVE      01/16/2018 Cancer Staging    Staging form: Breast, AJCC 8th Edition - Clinical stage from 01/16/2018: Stage IA (cT1b, cN0, cM0, G1, ER+, PR+, HER2-) - Signed by Truitt Merle, MD on 01/25/2018      01/24/2018 Initial Diagnosis    Malignant neoplasm of lower-inner quadrant of left breast in female, estrogen receptor positive ([place])        HORMONAL RISK FACTORS:  Menarche was at age 50.  OCP use for approximately 2 years.  Ovaries intact: no.  Hysterectomy: yes.  Menopausal status: postmenopausal.  HRT use: 10 years. Colonoscopy: yes; has 3 large polyps, told to have another in 3 years to monitory these polyp sites. Mammogram within the last year: yes.   Past Medical History:  Diagnosis Date  . Anxiety   . Arthritis   . Depression   . Family history of breast cancer   . Family history of colon cancer   . Family history of leukemia   . Hypertension     Past Surgical History:  Procedure Laterality Date  . BACK SURGERY    . BREAST BIOPSY Left 03/10/2010   benign  . CHOLECYSTECTOMY    . HYSTERECTOMY ABDOMINAL WITH SALPINGECTOMY    . NECK SURGERY    . TONSILLECTOMY  Social History   Socioeconomic History  . Marital status: Married    Spouse name: Not on file  . Number of children: Not on file  . Years of education: Not on file  . Highest education level: Not on file  Occupational History  . Not on file  Social Needs  . Financial resource strain: Not on file  . Food insecurity:    Worry: Not on file    Inability: Not on file  . Transportation needs:    Medical: Not on file    Non-medical: Not on file  Tobacco Use  . Smoking status: Never Smoker  . Smokeless tobacco: Never Used   Substance and Sexual Activity  . Alcohol use: Yes    Comment: social drinker  . Drug use: No  . Sexual activity: Not on file  Lifestyle  . Physical activity:    Days per week: Not on file    Minutes per session: Not on file  . Stress: Not on file  Relationships  . Social connections:    Talks on phone: Not on file    Gets together: Not on file    Attends religious service: Not on file    Active member of club or organization: Not on file    Attends meetings of clubs or organizations: Not on file    Relationship status: Not on file  Other Topics Concern  . Not on file  Social History Narrative  . Not on file     FAMILY HISTORY:  We obtained a detailed, 4-generation family history.  Significant diagnoses are listed below: Family History  Problem Relation Age of Onset  . Breast cancer Mother 45       deceased @ 56  . Breast cancer Maternal Aunt        dx 36?, died in 35's  . Breast cancer Maternal Aunt        dx 31?, died in 50's  . Breast cancer Maternal Aunt        dx 76?, died in 55's  . Leukemia Son 12       deceased @ 77  . Stomach cancer Maternal Grandmother 76  . Leukemia Maternal Uncle 68  . Pancreatic cancer Paternal Uncle 47  . Colon cancer Brother 59  . Cancer - Other Brother        pituitary cancer 60's  . Cancer Brother        neck/thyroid/throat cancer? 60's/70's  . Skin cancer Brother   . Leukemia Father        25's  . Colon cancer Cousin 103   [name] has 3 sons.  1 died of luekemia at the age of 2, dx at 20.  [name] 2 living sons are 66 and 47.  [name] has several granddaughters.  [name] has 1 brother who is 22 and has had several cancers.  The first was colon cancer dx in his 21's.  Since then he has been dx with skin cancer, cancer of the pituitary gland, and a throat/thyroid/neck cancer- type unk. He has 1 daughter with no history of cancer.   [name] father: died at 69 due to leukemia dx in his  67's Paternal Aunts/Uncles: 1 paternal aunt died of stroke. 1 paternal uncle had pancreatic cancer dx in hi s80's.   Paternal cousins: no history of cancer.  Paternal grandfather: unk cause of death, died in 46's Paternal grandmother:cause of death unk, died in 29's  [name]'s mother: dx  breast cancer at 51, it metastasized to brain. Died at 74.  Maternal Aunts/Uncles: 3 maternal aunts with breast cancer (thinks they were dx  About 7, all died in 85's) and 1 maternal uncle who had leukemia in 61's.  Maternal cousins: 1 maternal cousin died of colon cancer in his 5's, 1 maternal cousin had an elective bilateral mastectomy due to cystic breasts in her 16's.  Maternal grandfather: died of dimentia/age related disease Maternal grandmother:died of stomach cancer in her 63's  Ms. Amundson is unaware of previous family history of genetic testing for hereditary cancer risks. Patient's maternal ancestors are of N. European descent, and paternal ancestors are of English descent. There is no reported Ashkenazi Jewish ancestry. There is no known consanguinity.  GENETIC COUNSELING ASSESSMENT: ALIZAY BRONKEMA is a 72 y.o. female with a personal and family history which is somewhat suggestive of a Hereditary Cancer Predisposition Syndrome. We, therefore, discussed and recommended the following at today's visit.   DISCUSSION: We reviewed the characteristics, features and inheritance patterns of hereditary cancer syndromes. We also discussed genetic testing, including the appropriate family members to test, the process of testing, insurance coverage and turn-around-time for results. We discussed the implications of a negative, positive and/or variant of uncertain significant result. We recommended [name] pursue genetic testing for the Multi-Cancer gene panel.   [name] expressed that these results would not change her breast surgical decision.   The Multi-Cancer Panel offered by [facility]  includes sequencing and/or deletion duplication testing of the following 84 genes: AIP, ALK, APC, ATM, AXIN2,BAP1,  BARD1, BLM, BMPR1A, BRCA1, BRCA2, BRIP1, CASR, CDC73, CDH1, CDK4, CDKN1B, CDKN1C, CDKN2A (p14ARF), CDKN2A (p16INK4a), CEBPA, CHEK2, CTNNA1, DICER1, DIS3L2, EGFR (c.2369C>T, p.Thr790Met variant only), EPCAM (Deletion/duplication testing only), FH, FLCN, GATA2, GPC3, GREM1 (Promoter region deletion/duplication testing only), HOXB13 (c.251G>A, p.Gly84Glu), HRAS, KIT, MAX, MEN1, MET, MITF (c.952G>A, p.Glu318Lys variant only), MLH1, MSH2, MSH3, MSH6, MUTYH, NBN, NF1, NF2, NTHL1, PALB2, PDGFRA, PHOX2B, PMS2, POLD1, POLE, POT1, PRKAR1A, PTCH1, PTEN, RAD50, RAD51C, RAD51D, RB1, RECQL4, RET, RUNX1, SDHAF2, SDHA (sequence changes only), SDHB, SDHC, SDHD, SMAD4, SMARCA4, SMARCB1, SMARCE1, STK11, SUFU, TERC, TERT, TMEM127, TP53, TSC1, TSC2, VHL, WRN and WT1.   We discussed that only 5-10% of cancers are associated with a Hereditary cancer predisposition syndrome.  One of the most common hereditary cancer syndromes that increases breast cancer risk is called Hereditary Breast and Ovarian Cancer (HBOC) syndrome.  This syndrome is caused by mutations in the BRCA1 and BRCA2 genes.  This syndrome increases an individual's lifetime risk to develop breast, ovarian, pancreatic, and other types of cancer.  There are also many other cancer predisposition syndromes caused by mutations in several other genes.  We discussed that if she is found to have a mutation in one of these genes, it may impact future medical management recommendations such as increased cancer screenings and consideration of risk reducing surgeries.  A positive result could also have implications for the patient's family members.  A Negative result would mean we were unable to identify a hereditary component to her cancer, but does not rule out the possibility of a hereditary basis for her cancer.  There could be mutations that are undetectable by  current technology, or in genes not yet tested or identified to increase cancer risk.    We discussed the potential to find a Variant of Uncertain Significance or VUS.  These are variants that have not yet been identified as pathogenic or benign, and it is unknown if this variant is  associated with increased cancer risk or if this is a normal finding.  Most VUS's are reclassified to benign or likely benign.   It should not be used to make medical management decisions. With time, we suspect the lab will determine the significance of any VUS's identified if any.   Based on Ms. Damaso's personal and family history of cancer, she meets medical criteria for genetic testing. Despite that she meets criteria, she may still have an out of pocket cost. She was given the contact information for the laboratory if she has further billing questions.   PLAN: After considering the risks, benefits, and limitations, [name]  provided informed consent to pursue genetic testing and the blood sample was sent to [facility] for analysis of the Multi-Cancer Panel. Results should be available within approximately 2-3 weeks' time, at which point they will be disclosed by telephone to [name], as will any additional recommendations warranted by these results. [name] will receive a summary of her genetic counseling visit and a copy of her results once available. This information will also be available in Epic. We encouraged [name] to remain in contact with cancer genetics annually so that we can continuously update the family history and inform her of any changes in cancer genetics and testing that may be of benefit for her family. [name] questions were answered to her satisfaction today. Our contact information was provided should additional questions or concerns arise.  Based on [name]'s family history, we recommended her brother/other relatives on both sides of the family, also  have genetic counseling and testing. [name] will let us know if we can be of any assistance in coordinating genetic counseling and/or testing for this family member.   Lastly, we encouraged Ms. Hosack to remain in contact with cancer genetics annually so that we can continuously update the family history and inform her of any changes in cancer genetics and testing that may be of benefit for this family.   Ms.  Harbour questions were answered to her satisfaction today. Our contact information was provided should additional questions or concerns arise. Thank you for the referral and allowing [place] to share in the care of your patient.   Tana Felts, MS, [facility] Certified Genetic Counselor [email or web address] phone: [phone]  The patient was seen for a total of 40 minutes in face-to-face genetic counseling.  This patient was discussed with Drs. Magrinat, Lindi Adie and/or Burr Medico who agrees with the above.

## 2018-02-02 ENCOUNTER — Telehealth: Payer: Self-pay | Admitting: *Deleted

## 2018-02-02 NOTE — Telephone Encounter (Signed)
Spoke with pt concerning [place] from 7.24.19. Denies questions or concerns regarding dx or treatment care plan. Encourage pt to call with needs. Received verbal understanding.

## 2018-02-06 ENCOUNTER — Ambulatory Visit (HOSPITAL_COMMUNITY)
Admission: RE | Admit: 2018-02-06 | Discharge: 2018-02-06 | Disposition: A | Discharge status: Home / Self Care | Payer: Medicare Other | Source: Ambulatory Visit | Attending: General Surgery | Admitting: General Surgery

## 2018-02-06 DIAGNOSIS — Z170 Estrogen receptor positive status [ER+]: Secondary | ICD-10-CM | POA: Diagnosis present

## 2018-02-06 DIAGNOSIS — C50312 Malignant neoplasm of lower-inner quadrant of left female breast: Secondary | ICD-10-CM | POA: Diagnosis present

## 2018-02-06 MED ORDER — GADOBENATE DIMEGLUMINE 529 MG/ML IV SOLN
15.0000 mL | Freq: Once | INTRAVENOUS | Status: AC | PRN
  Administered 2018-02-06: 14 mL via INTRAVENOUS

## 2018-02-07 NOTE — Progress Notes (Signed)
Please let patient know the MRI was negative for any other concerning findings.  It just showed the very small cancer that we know about.  I have sent orders to scheduling.

## 2018-02-10 ENCOUNTER — Other Ambulatory Visit: Payer: Self-pay | Admitting: General Surgery

## 2018-02-10 DIAGNOSIS — Z170 Estrogen receptor positive status [ER+]: Principal | ICD-10-CM

## 2018-02-10 DIAGNOSIS — C50212 Malignant neoplasm of upper-inner quadrant of left female breast: Secondary | ICD-10-CM

## 2018-02-13 ENCOUNTER — Telehealth: Payer: Self-pay | Admitting: Genetics

## 2018-02-13 NOTE — Telephone Encounter (Signed)
Revealed negative genetic testing.  Revealed that a VUS in TMEM127 was identified.   This normal result is reassuring and indicates that we did not find a genetic cause for [name]'s cancer.   However, genetic testing is not perfect, and cannot definitively rule out a hereditary cause.  It will be important for her to keep in contact with genetics to learn if any additional testing may be needed in the future.     She still has a pretty significant family history that is suspicious for a hereditary cancer risk, we recommended other relatives have genetic testing because there could still be a genetic mutation in the family that [name] did not inherit and therefore would not be found in her.  Her realties should make their doctors aware of the family history so they can get the best individualized screening for them.   She should continue all recommendations and screenings recommended to her by her doctors.

## 2018-02-14 ENCOUNTER — Encounter: Payer: Self-pay | Admitting: Genetics

## 2018-02-14 ENCOUNTER — Ambulatory Visit: Payer: Self-pay | Admitting: Genetics

## 2018-02-14 DIAGNOSIS — Z800 Family history of malignant neoplasm of digestive organs: Secondary | ICD-10-CM

## 2018-02-14 DIAGNOSIS — C50312 Malignant neoplasm of lower-inner quadrant of left female breast: Secondary | ICD-10-CM

## 2018-02-14 DIAGNOSIS — Z1379 Encounter for other screening for genetic and chromosomal anomalies: Secondary | ICD-10-CM

## 2018-02-14 DIAGNOSIS — Z170 Estrogen receptor positive status [ER+]: Secondary | ICD-10-CM

## 2018-02-14 DIAGNOSIS — Z803 Family history of malignant neoplasm of breast: Secondary | ICD-10-CM

## 2018-02-14 DIAGNOSIS — Z806 Family history of leukemia: Secondary | ICD-10-CM

## 2018-02-14 NOTE — Pre-Procedure Instructions (Signed)
KARENNA ROMANOFF  02/14/2018      Your procedure is scheduled on February 21, 2018.  Report to [facility] Admitting at 05:30 A.M.  Call this number if you have problems the morning of surgery:  [phone]   Remember:  Do not eat or drink after midnight.  You may drink clear liquids until 04:30am .  Clear liquids allowed are:            Water, Clear Tea, Black Coffee only, Plain Jell-O only and Gatorade    Take these medicines the morning of surgery with A SIP OF WATER : Aripiprazole (Abilify) Carvedilol (Coreg) Duloxetine (Cymbalta) Laratadine (Claritin) Lorazepam (Ativan) if needed Omeprazole (Prilosec)  7 days prior to surgery STOP taking any Aspirin (unless otherwise instructed by your surgeon), Aleve, Naproxen, Meloxicam, Mobic, Ibuprofen, Motrin, Advil, Goody's, BC's, all herbal medications, fish oil, and all vitamins.    Do not wear jewelry, make-up or nail polish.  Do not wear lotions, powders, or perfumes, or deodorant.  Do not shave 48 hours prior to surgery.    Do not bring valuables to the hospital.   [facility] is not responsible for any belongings or valuables.  Contacts, dentures or bridgework may not be worn into surgery.  Leave your suitcase in the car.  After surgery it may be brought to your room.  For patients admitted to the hospital, discharge time will be determined by your treatment team.  Patients discharged the day of surgery will not be allowed to drive home.   Special instructions:   [place]- Preparing For Surgery  Before surgery, you can play an important role. Because skin is not sterile, your skin needs to be as free of germs as possible. You can reduce the number of germs on your skin by washing with CHG (chlorahexidine gluconate) Soap before surgery.  CHG is an antiseptic cleaner which kills germs and bonds with the skin to continue killing germs even after washing.    Oral Hygiene is also important to reduce your risk of  infection.  Remember - BRUSH YOUR TEETH THE MORNING OF SURGERY WITH YOUR REGULAR TOOTHPASTE  Please do not use if you have an allergy to CHG or antibacterial soaps. If your skin becomes reddened/irritated stop using the CHG.  Do not shave (including legs and underarms) for at least 48 hours prior to first CHG shower. It is OK to shave your face.  Please follow these instructions carefully.   1. Shower the NIGHT BEFORE SURGERY and the MORNING OF SURGERY with CHG.   2. If you chose to wash your hair, wash your hair first as usual with your normal shampoo.  3. After you shampoo, rinse your hair and body thoroughly to remove the shampoo.  4. Use CHG as you would any other liquid soap. You can apply CHG directly to the skin and wash gently with a scrungie or a clean washcloth.   5. Apply the CHG Soap to your body ONLY FROM THE NECK DOWN.  Do not use on open wounds or open sores. Avoid contact with your eyes, ears, mouth and genitals (private parts). Wash Face and genitals (private parts)  with your normal soap.  6. Wash thoroughly, paying special attention to the area where your surgery will be performed.  7. Thoroughly rinse your body with warm water from the neck down.  8. DO NOT shower/wash with your normal soap after using and rinsing off the CHG Soap.  9. [place]  yourself dry with a CLEAN TOWEL.  10. Wear CLEAN PAJAMAS to bed the night before surgery, wear comfortable clothes the morning of surgery  11. Place CLEAN SHEETS on your bed the night of your first shower and DO NOT SLEEP WITH PETS.    Day of Surgery:  Do not apply any deodorants/lotions.  Please wear clean clothes to the hospital/surgery center.   Remember to brush your teeth WITH YOUR REGULAR TOOTHPASTE.    Please read over the following fact sheets that you were given.

## 2018-02-14 NOTE — Progress Notes (Signed)
HPI:  Ms. Kath was previously seen in the [facility] on 01/30/2018 due to a personal and family history of cancer and concerns regarding a hereditary predisposition to cancer. Please refer to our prior cancer genetics clinic note for more information regarding Ms. Sun's medical, social and family histories, and our assessment and recommendations, at the time. Ms. Varas recent genetic test results were disclosed to her, as well as recommendations warranted by these results. These results and recommendations are discussed in more detail below.  CANCER HISTORY:  Oncology History   Cancer Staging Malignant neoplasm of lower-inner quadrant of left breast in female, estrogen receptor positive ([place]) Staging form: Breast, AJCC 8th Edition - Clinical stage from 01/16/2018: Stage IA (cT1b, cN0, cM0, G1, ER+, PR+, HER2-) - Signed by Truitt Merle, MD on 01/25/2018       Malignant neoplasm of lower-inner quadrant of left breast in female, estrogen receptor positive ([place])   01/16/2018 Mammogram    Diagnostic Mammogram of left breast 01/16/18 IMPRESSION: 1.  There is a suspicious mass measuring 7x6x7 mm in the left breast at 8 o'clock position 1 cm from the nipple. 2.  No evidence of left axillary lymphadenopathy. RECOMMENDATION: Ultrasound guided biopsy is recommended for the left breast mass.     01/16/2018 Initial Biopsy    Diagnosis 01/16/18 Breast, left, needle core biopsy, 8 o'clock - INVASIVE DUCTAL CARCINOMA. - DUCTAL CARCINOMA IN SITU.     01/16/2018 Receptors her2    Estrogen Receptor: 100%, POSITIVE, STRONG STAINING INTENSITY Progesterone Receptor: 90%, POSITIVE, STRONG STAINING INTENSITY Proliferation Marker Ki67: 10% HER2 - NEGATIVE    01/16/2018 Cancer Staging    Staging form: Breast, AJCC 8th Edition - Clinical stage from 01/16/2018: Stage IA (cT1b, cN0, cM0, G1, ER+, PR+, HER2-) - Signed by Truitt Merle, MD on 01/25/2018    01/24/2018 Initial Diagnosis      Malignant neoplasm of lower-inner quadrant of left breast in female, estrogen receptor positive ([place])    02/09/2018 Genetic Testing    The Multi-Cancer Panel + Preliminary evidence colorectal panel + Myelodysplastic Syndrome/Leukemia panel was ordered (91 genes). T AIP,ALK, APC, ATM, AXIN2,BAP1,  BARD1, BLM, BMPR1A, BRCA1, BRCA2, BRIP1, BUB1B, CASR, CDC73, CDH1, CDK4, CDKN1B, CDKN1C, CDKN2A (p14ARF), CDKN2A (p16INK4a), CEBPA, CEP57, CHEK2, CTNNA1, DICER1, DIS3L2, EGFR (c.2369C>T, p.Thr790Met variant only), ENG, EPCAM (Deletion/duplication testing only), FH, FLCN, GATA2, GALNT12, GPC3, GREM1 (Promoter region deletion/duplication testing only), HOXB13 (c.251G>A, p.Gly84Glu), HRAS, KIT, MAX, MEN1, MET, MITF (c.952G>A, p.Glu318Lys variant only), MLH1, MSH2, MSH3, MSH6, MUTYH, NBN, NF1, NF2, NTHL1, PALB2, PDGFRA, PHOX2B, PMS2, POLD1, POLE, POT1, PRKAR1A, PTCH1, PTEN, RAD50, RAD51C, RAD51D, RB1, RECQL4, RET, RUNX1, RSP20, RNF43, SDHAF2, SDHA (sequence changes only),SDHAF2, SDHB, SDHC, SDHD, SMAD4, SMARCA4, SMARCB1, SMARCE1, STK11, SUFU, TERC, TERT, TMEM127, TP53, TSC1, TSC2, VHL, WRN and WT1.   Results: No pathogenic variants identified.  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  The date of this test report is 02/09/2018.        FAMILY HISTORY:  We obtained a detailed, 4-generation family history.  Significant diagnoses are listed below: Family History  Problem Relation Age of Onset  . Breast cancer Mother 77       deceased @ 75  . Breast cancer Maternal Aunt        dx 46?, died in 14's  . Breast cancer Maternal Aunt        dx 36?, died in 76's  . Breast cancer Maternal Aunt  dx 33?, died in 39's  . Leukemia Son 12       deceased @ 68  . Stomach cancer Maternal Grandmother 8  . Leukemia Maternal Uncle 85  . Pancreatic cancer Paternal Uncle 64  . Colon cancer Brother 35  . Cancer - Other Brother        pituitary cancer 60's  . Cancer  Brother        neck/thyroid/throat cancer? 60's/70's  . Skin cancer Brother   . Leukemia Father        59's  . Colon cancer Cousin 13    Ms. Linders has 3 sons.  1 died of luekemia at the age of 109, dx at 47.  Ms. Fatula 2 living sons are 75 and 32.  Ms. Silveri has several granddaughters.  Ms. Welte has 1 brother who is 8 and has had several cancers.  The first was colon cancer dx in his 47's.  Since then he has been dx with skin cancer, cancer of the pituitary gland, and a throat/thyroid/neck cancer- type unk. He has 1 daughter with no history of cancer.   Ms. Madia father: died at 34 due to leukemia dx in his 68's Paternal Aunts/Uncles: 1 paternal aunt died of stroke. 1 paternal uncle had pancreatic cancer dx in hi s57's.   Paternal cousins: no history of cancer.  Paternal grandfather: unk cause of death, died in 28's Paternal grandmother:cause of death unk, died in 72's  Ms. Sano's mother: dx breast cancer at 43, it metastasized to brain. Died at 23.  Maternal Aunts/Uncles: 3 maternal aunts with breast cancer (thinks they were dx  About 110, all died in 86's) and 1 maternal uncle who had leukemia in 14's.  Maternal cousins: 1 maternal cousin died of colon cancer in his 63's, 1 maternal cousin had an elective bilateral mastectomy due to cystic breasts in her 69's.  Maternal grandfather: died of dimentia/age related disease Maternal grandmother:died of stomach cancer in her 51's  Ms. Nevitt is unaware of previous family history of genetic testing for hereditary cancer risks. Patient's maternal ancestors are of N. European descent, and paternal ancestors are of English descent. There is no reported Ashkenazi Jewish ancestry. There is no known consanguinity.  GENETIC TEST RESULTS: Genetic testing performed through [facility] Multi-Cancer Panel + Colorectal preliminary evidence panel + Myelodysplastic/Leukemia Panel reported out on 02/09/2018 showed no pathogenic  mutations. Gene List (38): AIP,ALK, APC, ATM, AXIN2,BAP1,  BARD1, BLM, BMPR1A, BRCA1, BRCA2, BRIP1, BUB1B, CASR, CDC73, CDH1, CDK4, CDKN1B, CDKN1C, CDKN2A (p14ARF), CDKN2A (p16INK4a), CEBPA, CEP57, CHEK2, CTNNA1, DICER1, DIS3L2, EGFR (c.2369C>T, p.Thr790Met variant only), ENG, EPCAM (Deletion/duplication testing only), FH, FLCN, GATA2, GALNT12, GPC3, GREM1 (Promoter region deletion/duplication testing only), HOXB13 (c.251G>A, p.Gly84Glu), HRAS, KIT, MAX, MEN1, MET, MITF (c.952G>A, p.Glu318Lys variant only), MLH1, MSH2, MSH3, MSH6, MUTYH, NBN, NF1, NF2, NTHL1, PALB2, PDGFRA, PHOX2B, PMS2, POLD1, POLE, POT1, PRKAR1A, PTCH1, PTEN, RAD50, RAD51C, RAD51D, RB1, RECQL4, RET, RUNX1, RSP20, RNF43, SDHAF2, SDHA (sequence changes only),SDHAF2, SDHB, SDHC, SDHD, SMAD4, SMARCA4, SMARCB1, SMARCE1, STK11, SUFU, TERC, TERT, TMEM127, TP53, TSC1, TSC2, VHL, WRN and WT1.   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   The test report will be scanned into EPIC and will be located under the Molecular Pathology section of the Results Review tab. A portion of the result report is included below for reference.     We discussed with Ms. Mckenzie that because current genetic testing is not perfect, it is possible there may be  a gene mutation in one of these genes that current testing cannot detect, but that chance is small.  We also discussed, that there could be another gene that has not yet been discovered, or that we have not yet tested, that is responsible for the cancer diagnoses in the family. It is also possible there is a hereditary cause for the cancer in the family that Ms. Fishman did not inherit and therefore was not identified in her testing.  Therefore, it is important to remain in touch with cancer genetics in the future so that we can continue to offer Ms. Riding the most up to date genetic testing.   Regarding the VUS in TMEM127: At this time, it is unknown if  this variant is associated with increased cancer risk or if this is a normal finding, but most variants such as this get reclassified to being inconsequential. It should not be used to make medical management decisions. With time, we suspect the lab will determine the significance of this variant, if any. If we do learn more about it, we will try to contact Ms. Palo to discuss it further. However, it is important to stay in touch with [place] periodically and keep the address and phone number up to date.  ADDITIONAL GENETIC TESTING: We discussed with Ms. Jumper that her genetic testing was fairly extensive.  If there are are genes identified to increase cancer risk that can be analyzed in the future, we would be happy to discuss and coordinate this testing at that time.    CANCER SCREENING RECOMMENDATIONS: This negative result means that we were unable to identify a hereditary cause for her personal and family history of cancer at this time.  This result does not rule out a hereditary cause for her cancer. It is still possible that there could be genetic mutations that are undetectable by current technology, or genetic mutations in genes that have not been tested or identified to increase cancer risk.  Her family history is still somewhat suspicious for a hereditary cancer risk.   Therefore, it is recommended she continue to follow the cancer management and screening guidelines provided by her oncology and primary healthcare provider. An individual's cancer risk is not determined by genetic test results alone.  Overall cancer risk assessment includes additional factors such as personal medical history, family history, etc.  These should be used to make a personalized plan for cancer prevention and surveillance.    We recommend she continue following her GI provider's colonoscopy screening recommendations (taking into account her family history of colon cancer).    RECOMMENDATIONS FOR FAMILY MEMBERS:   Relatives in this family might be at some increased risk of developing cancer, over the general population risk, simply due to the family history of cancer.  We recommended women in this family have a yearly mammogram beginning at age 55, or 37 years younger than the earliest onset of cancer, an annual clinical breast exam, and perform monthly breast self-exams. Women in this family should also have a gynecological exam as recommended by their primary provider. All family members should have a colonoscopy by age 26 (or as directed by their doctors).  All family members should inform their physicians about the family history of cancer so their doctors can make the most appropriate screening recommendations for them.   It is also possible there is a hereditary cause for the cancer in Ms. Litsey's family that she did not inherit and therefore was not identified in her.  Therefore, we recommended her brother and all  maternal and paternal relatives also have genetic counseling and testing. Ms. Stankovich will let us know if we can be of any assistance in coordinating genetic counseling and/or testing for these family members.   FOLLOW-UP: Lastly, we discussed with Ms. Salay that cancer genetics is a rapidly advancing field and it is possible that new genetic tests will be appropriate for her and/or her family members in the future. We encouraged her to remain in contact with cancer genetics on an annual basis so we can update her personal and family histories and let her know of advances in cancer genetics that may benefit this family.   Our contact number was provided. Ms. Sova questions were answered to her satisfaction, and she knows she is welcome to call us at anytime with additional questions or concerns.   Ferol Luz, MS, [facility] Certified Genetic Counselor [email or web address]

## 2018-02-15 ENCOUNTER — Other Ambulatory Visit: Payer: Self-pay

## 2018-02-15 ENCOUNTER — Encounter (HOSPITAL_COMMUNITY)
Admission: RE | Admit: 2018-02-15 | Discharge: 2018-02-15 | Disposition: A | Discharge status: Home / Self Care | Payer: Medicare Other | Source: Ambulatory Visit | Attending: General Surgery | Admitting: General Surgery

## 2018-02-15 ENCOUNTER — Encounter (HOSPITAL_COMMUNITY): Payer: Self-pay

## 2018-02-15 DIAGNOSIS — C50212 Malignant neoplasm of upper-inner quadrant of left female breast: Secondary | ICD-10-CM | POA: Diagnosis not present

## 2018-02-15 DIAGNOSIS — Z170 Estrogen receptor positive status [ER+]: Secondary | ICD-10-CM | POA: Insufficient documentation

## 2018-02-15 DIAGNOSIS — Z01818 Encounter for other preprocedural examination: Secondary | ICD-10-CM | POA: Insufficient documentation

## 2018-02-15 LAB — CBC
HCT: 42.2 % (ref 36.0–46.0)
Hemoglobin: 14.1 g/dL (ref 12.0–15.0)
MCH: 27 pg (ref 26.0–34.0)
MCHC: 33.4 g/dL (ref 30.0–36.0)
MCV: 80.8 fL (ref 78.0–100.0)
PLATELETS: 257 10*3/uL (ref 150–400)
RBC: 5.22 MIL/uL — AB (ref 3.87–5.11)
RDW: 13 % (ref 11.5–15.5)
WBC: 8.8 10*3/uL (ref 4.0–10.5)

## 2018-02-15 LAB — BASIC METABOLIC PANEL
ANION GAP: 12 (ref 5–15)
BUN: 10 mg/dL (ref 8–23)
CO2: 27 mmol/L (ref 22–32)
Calcium: 9.5 mg/dL (ref 8.9–10.3)
Chloride: 94 mmol/L — ABNORMAL LOW (ref 98–111)
Creatinine, Ser: 0.86 mg/dL (ref 0.44–1.00)
GFR calc Af Amer: >60 mL/min (ref >60)
Glucose, Bld: 101 mg/dL — ABNORMAL HIGH (ref 70–99)
POTASSIUM: 2.7 mmol/L — AB (ref 3.5–5.1)
SODIUM: 133 mmol/L — AB (ref 135–145)

## 2018-02-15 NOTE — Progress Notes (Signed)
PCP: Burnard Bunting, MD  Cardiologist: pt denies  EKG: pt denies, obtained today  Stress test: pt denies  ECHO: pt denies  Cardiac Cath: pt denies  Chest x-ray: denies past year, no recent respiratory infections/complications

## 2018-02-15 NOTE — Progress Notes (Signed)
Can you of you guys call and make sure she takes some KCl?  I would recommend 40 meq TID.

## 2018-02-15 NOTE — Progress Notes (Signed)
CRITICAL VALUE ALERT  Critical Value:  K 2.7  Date & Time Notied:  02/15/18  1347  Provider Notified: Elmo Putt at Dr. Marlowe Aschoff office  Orders Received/Actions taken: Patient had labs drawn at PAT appointment and patient had left the hospital when the labs had resulted.  Nurse called Dr. Marlowe Aschoff office and notified Elmo Putt of critical potassium.

## 2018-02-16 NOTE — Progress Notes (Signed)
Anesthesia Chart Review:  Case:  [id number] Date/Time:  02/21/18 0715   Procedure:  BREAST LUMPECTOMY WITH RADIOACTIVE SEED AND SENTINEL LYMPH NODE BIOPSY (Left Breast)   Anesthesia type:  General   Pre-op diagnosis:  LEFT BREAST CANCER   Location:  [facility] ROOM 09 / [facility]   Surgeon:  Stark Klein, MD      DISCUSSION: 72yo female never smoker. Pertinent hx includes HTN, Anxiety, Arthritis, Depression, Hx of leukemia.  PAT labwork revealed significant hypokalemia 2.7. This result was called to Dr. Marlowe Aschoff office and per her note 02/15/2018 it was recommended the pt start KCl 73meq TID.  Will order istat4 for DOS. Anticipate she can proceed as scheduled if potassium acceptable  VS: BP 137/88   Pulse 76   Temp 36.5 C   Resp 18   Ht 5' 6.5" (1.689 m)   Wt 71.1 kg   SpO2 99%   BMI 24.93 kg/m   PROVIDERS: Burnard Bunting, MD is PCP  LABS: Hypokalemia. Potassium replacement per Dr. Barry Dienes. Check istat4 DOS. (all labs ordered are listed, but only abnormal results are displayed)  Labs Reviewed  CBC - Abnormal; Notable for the following components:      Result Value   RBC 5.22 (*)    All other components within normal limits  BASIC METABOLIC PANEL - Abnormal; Notable for the following components:   Sodium 133 (*)    Potassium 2.7 (*)    Chloride 94 (*)    Glucose, Bld 101 (*)    All other components within normal limits     IMAGES: CHEST  2 VIEW 02/16/2018  COMPARISON:  None.  FINDINGS: Mediastinum and hilar structures are normal. Lungs are clear. Heart size normal. Apical pleural thickening consistent with scarring. Prior lumbar spine fusion. Degenerative changes thoracic spine. Prior cholecystectomy.  IMPRESSION: No active cardiopulmonary disease.   EKG: 02/15/2018: Normal sinus rhythm. Possible Left atrial enlargement  CV: N/A  Past Medical History:  Diagnosis Date  . Anxiety   . Arthritis   . Depression   . Family history of breast cancer   . Family  history of colon cancer   . Family history of leukemia   . Hypertension     Past Surgical History:  Procedure Laterality Date  . BACK SURGERY    . BREAST BIOPSY Left 03/10/2010   benign  . CHOLECYSTECTOMY    . HYSTERECTOMY ABDOMINAL WITH SALPINGECTOMY    . NECK SURGERY    . TONSILLECTOMY      MEDICATIONS: . ARIPiprazole (ABILIFY) 5 MG tablet  . Ascorbic Acid (VITAMIN C) 1000 MG tablet  . carvedilol (COREG) 12.5 MG tablet  . DULoxetine (CYMBALTA) 60 MG capsule  . ibuprofen (ADVIL,MOTRIN) 200 MG tablet  . loratadine (CLARITIN) 10 MG tablet  . LORazepam (ATIVAN) 1 MG tablet  . meloxicam (MOBIC) 7.5 MG tablet  . olmesartan-hydrochlorothiazide (BENICAR HCT) 40-25 MG tablet  . omeprazole (PRILOSEC) 40 MG capsule  . Probiotic Product (PROBIOTIC PO)  . traZODone (DESYREL) 50 MG tablet   No current facility-administered medications for this encounter.      Wynonia Musty [facility] Short Stay Center/Anesthesiology Phone [phone] 02/16/2018 11:08 AM

## 2018-02-20 ENCOUNTER — Encounter (HOSPITAL_COMMUNITY): Payer: Self-pay | Admitting: Anesthesiology

## 2018-02-20 ENCOUNTER — Ambulatory Visit
Admission: RE | Admit: 2018-02-20 | Discharge: 2018-02-20 | Disposition: A | Discharge status: Home / Self Care | Payer: Medicare Other | Source: Ambulatory Visit | Attending: General Surgery | Admitting: General Surgery

## 2018-02-20 DIAGNOSIS — Z170 Estrogen receptor positive status [ER+]: Principal | ICD-10-CM

## 2018-02-20 DIAGNOSIS — C50212 Malignant neoplasm of upper-inner quadrant of left female breast: Secondary | ICD-10-CM

## 2018-02-20 NOTE — Anesthesia Preprocedure Evaluation (Addendum)
Anesthesia Evaluation  Patient identified by MRN, date of birth, ID band Patient awake    Reviewed: Allergy & Precautions, NPO status , Patient's Chart, lab work & pertinent test results, reviewed documented beta blocker date and time   Airway Mallampati: III  TM Distance: >3 FB Neck ROM: Full    Dental no notable dental hx.    Pulmonary neg pulmonary ROS,    Pulmonary exam normal breath sounds clear to auscultation       Cardiovascular hypertension, Pt. on home beta blockers and Pt. on medications Normal cardiovascular exam Rhythm:Regular Rate:Normal  ECG: NSR, rate 80   Neuro/Psych PSYCHIATRIC DISORDERS Anxiety Depression negative neurological ROS     GI/Hepatic Neg liver ROS, GERD  Medicated and Controlled,  Endo/Other  negative endocrine ROS  Renal/GU negative Renal ROS     Musculoskeletal  (+) Arthritis ,   Abdominal   Peds  Hematology negative hematology ROS (+)   Anesthesia Other Findings LEFT BREAST CANCER  Reproductive/Obstetrics                            Lab Results  Component Value Date   WBC 8.8 02/15/2018   HGB 14.1 02/15/2018   HCT 42.2 02/15/2018   MCV 80.8 02/15/2018   PLT 257 02/15/2018   Lab Results  Component Value Date   CREATININE 0.86 02/15/2018   BUN 10 02/15/2018   NA 133 (L) 02/15/2018   K 2.7 (LL) 02/15/2018   CL 94 (L) 02/15/2018   CO2 27 02/15/2018   No results found for: INR, PROTIME  EKG: NSR  Anesthesia Physical Anesthesia Plan  ASA: II  Anesthesia Plan: General and Regional   Post-op Pain Management: GA combined w/ Regional for post-op pain   Induction: Intravenous  PONV Risk Score and Plan: 4 or greater and Ondansetron, Dexamethasone, Midazolam and Treatment may vary due to age or medical condition  Airway Management Planned: LMA  Additional Equipment: None  Intra-op Plan:   Post-operative Plan: Extubation in OR  Informed  Consent: I have reviewed the patients History and Physical, chart, labs and discussed the procedure including the risks, benefits and alternatives for the proposed anesthesia with the patient or authorized representative who has indicated his/her understanding and acceptance.   Dental advisory given  Plan Discussed with: CRNA  Anesthesia Plan Comments:        Anesthesia Quick Evaluation

## 2018-02-21 ENCOUNTER — Encounter (HOSPITAL_COMMUNITY)
Admission: RE | Admit: 2018-02-21 | Discharge: 2018-02-21 | Disposition: A | Discharge status: Home / Self Care | Payer: Medicare Other | Source: Ambulatory Visit | Attending: General Surgery | Admitting: General Surgery

## 2018-02-21 ENCOUNTER — Ambulatory Visit (HOSPITAL_COMMUNITY)
Admission: RE | Admit: 2018-02-21 | Discharge: 2018-02-21 | Disposition: A | Discharge status: Home / Self Care | Payer: Medicare Other | Source: Ambulatory Visit | Attending: General Surgery | Admitting: General Surgery

## 2018-02-21 ENCOUNTER — Encounter (HOSPITAL_COMMUNITY)
Admission: RE | Disposition: A | Discharge status: Home / Self Care | Payer: Self-pay | Source: Ambulatory Visit | Attending: General Surgery

## 2018-02-21 ENCOUNTER — Ambulatory Visit (HOSPITAL_COMMUNITY): Payer: Medicare Other | Admitting: Vascular Surgery

## 2018-02-21 ENCOUNTER — Ambulatory Visit (HOSPITAL_COMMUNITY): Payer: Medicare Other | Admitting: Physician Assistant

## 2018-02-21 ENCOUNTER — Ambulatory Visit
Admission: RE | Admit: 2018-02-21 | Discharge: 2018-02-21 | Disposition: A | Discharge status: Home / Self Care | Payer: Medicare Other | Source: Ambulatory Visit | Attending: General Surgery | Admitting: General Surgery

## 2018-02-21 ENCOUNTER — Encounter (HOSPITAL_COMMUNITY): Payer: Self-pay | Admitting: Urology

## 2018-02-21 DIAGNOSIS — Z170 Estrogen receptor positive status [ER+]: Principal | ICD-10-CM

## 2018-02-21 DIAGNOSIS — M1990 Unspecified osteoarthritis, unspecified site: Secondary | ICD-10-CM | POA: Insufficient documentation

## 2018-02-21 DIAGNOSIS — I10 Essential (primary) hypertension: Secondary | ICD-10-CM | POA: Insufficient documentation

## 2018-02-21 DIAGNOSIS — C50312 Malignant neoplasm of lower-inner quadrant of left female breast: Secondary | ICD-10-CM | POA: Diagnosis present

## 2018-02-21 DIAGNOSIS — K219 Gastro-esophageal reflux disease without esophagitis: Secondary | ICD-10-CM | POA: Diagnosis not present

## 2018-02-21 DIAGNOSIS — Z79899 Other long term (current) drug therapy: Secondary | ICD-10-CM | POA: Diagnosis not present

## 2018-02-21 DIAGNOSIS — C50212 Malignant neoplasm of upper-inner quadrant of left female breast: Secondary | ICD-10-CM

## 2018-02-21 DIAGNOSIS — F418 Other specified anxiety disorders: Secondary | ICD-10-CM | POA: Diagnosis not present

## 2018-02-21 HISTORY — PX: BREAST LUMPECTOMY WITH RADIOACTIVE SEED AND SENTINEL LYMPH NODE BIOPSY: SHX6550

## 2018-02-21 HISTORY — PX: BREAST LUMPECTOMY: SHX2

## 2018-02-21 LAB — POCT I-STAT 4, (NA,K, GLUC, HGB,HCT)
Glucose, Bld: 85 mg/dL (ref 70–99)
HCT: 39 % (ref 36.0–46.0)
Hemoglobin: 13.3 g/dL (ref 12.0–15.0)
Potassium: 3.3 mmol/L — ABNORMAL LOW (ref 3.5–5.1)
SODIUM: 129 mmol/L — AB (ref 135–145)

## 2018-02-21 SURGERY — BREAST LUMPECTOMY WITH RADIOACTIVE SEED AND SENTINEL LYMPH NODE BIOPSY
Anesthesia: General | Site: Breast | Laterality: Left

## 2018-02-21 MED ORDER — 0.9 % SODIUM CHLORIDE (POUR BTL) OPTIME
TOPICAL | Status: DC | PRN
  Administered 2018-02-21: 1000 mL

## 2018-02-21 MED ORDER — LIDOCAINE HCL 1 % IJ SOLN
INTRAMUSCULAR | Status: DC | PRN
  Administered 2018-02-21: 30 mL via INTRAMUSCULAR

## 2018-02-21 MED ORDER — LIDOCAINE 2% (20 MG/ML) 5 ML SYRINGE
INTRAMUSCULAR | Status: AC
  Filled 2018-02-21: qty 5

## 2018-02-21 MED ORDER — CELECOXIB 200 MG PO CAPS
200.0000 mg | ORAL_CAPSULE | ORAL | Status: AC
  Administered 2018-02-21: 200 mg via ORAL

## 2018-02-21 MED ORDER — MIDAZOLAM HCL 5 MG/5ML IJ SOLN: INTRAMUSCULAR | Status: DC | PRN

## 2018-02-21 MED ORDER — ONDANSETRON HCL 4 MG/2ML IJ SOLN
INTRAMUSCULAR | Status: DC | PRN
  Administered 2018-02-21: 4 mg via INTRAVENOUS

## 2018-02-21 MED ORDER — CELECOXIB 200 MG PO CAPS
ORAL_CAPSULE | ORAL | Status: AC
  Administered 2018-02-21: 200 mg via ORAL
  Filled 2018-02-21: qty 1

## 2018-02-21 MED ORDER — EPHEDRINE 5 MG/ML INJ
INTRAVENOUS | Status: AC
  Filled 2018-02-21: qty 10

## 2018-02-21 MED ORDER — CEFAZOLIN SODIUM-DEXTROSE 2-4 GM/100ML-% IV SOLN
INTRAVENOUS | Status: AC
  Filled 2018-02-21: qty 100

## 2018-02-21 MED ORDER — FENTANYL CITRATE (PF) 100 MCG/2ML IJ SOLN: INTRAMUSCULAR | Status: DC | PRN

## 2018-02-21 MED ORDER — TECHNETIUM TC 99M SULFUR COLLOID FILTERED
1.0000 | Freq: Once | INTRAVENOUS | Status: AC | PRN
  Administered 2018-02-21: 1 via INTRADERMAL

## 2018-02-21 MED ORDER — MIDAZOLAM HCL 5 MG/5ML IJ SOLN
INTRAMUSCULAR | Status: DC | PRN
  Administered 2018-02-21: 1 mg via INTRAVENOUS

## 2018-02-21 MED ORDER — FENTANYL CITRATE (PF) 100 MCG/2ML IJ SOLN
INTRAMUSCULAR | Status: DC | PRN
  Administered 2018-02-21 (×2): 50 ug via INTRAVENOUS

## 2018-02-21 MED ORDER — GABAPENTIN 300 MG PO CAPS
ORAL_CAPSULE | ORAL | Status: AC
  Filled 2018-02-21: qty 1

## 2018-02-21 MED ORDER — PHENYLEPHRINE 40 MCG/ML (10ML) SYRINGE FOR IV PUSH (FOR BLOOD PRESSURE SUPPORT)
PREFILLED_SYRINGE | INTRAVENOUS | Status: AC
  Filled 2018-02-21: qty 10

## 2018-02-21 MED ORDER — CHLORHEXIDINE GLUCONATE CLOTH 2 % EX PADS: 6.0000 | MEDICATED_PAD | Freq: Once | CUTANEOUS | Status: DC

## 2018-02-21 MED ORDER — ONDANSETRON HCL 4 MG/2ML IJ SOLN
INTRAMUSCULAR | Status: AC
  Filled 2018-02-21: qty 2

## 2018-02-21 MED ORDER — DEXAMETHASONE SODIUM PHOSPHATE 10 MG/ML IJ SOLN
INTRAMUSCULAR | Status: DC | PRN
  Administered 2018-02-21: 10 mg via INTRAVENOUS

## 2018-02-21 MED ORDER — DEXAMETHASONE SODIUM PHOSPHATE 10 MG/ML IJ SOLN
INTRAMUSCULAR | Status: AC
  Filled 2018-02-21: qty 1

## 2018-02-21 MED ORDER — FENTANYL CITRATE (PF) 250 MCG/5ML IJ SOLN
INTRAMUSCULAR | Status: AC
  Filled 2018-02-21: qty 5

## 2018-02-21 MED ORDER — LACTATED RINGERS IV SOLN
INTRAVENOUS | Status: DC | PRN
  Administered 2018-02-21 (×2): via INTRAVENOUS

## 2018-02-21 MED ORDER — ROPIVACAINE HCL 7.5 MG/ML IJ SOLN
INTRAMUSCULAR | Status: DC | PRN
  Administered 2018-02-21: 20 mL via PERINEURAL

## 2018-02-21 MED ORDER — OXYCODONE HCL 5 MG PO TABS: 2.5000 mg | ORAL_TABLET | ORAL | 0 refills | Status: DC | PRN

## 2018-02-21 MED ORDER — PHENYLEPHRINE HCL 10 MG/ML IJ SOLN
INTRAMUSCULAR | Status: DC | PRN
  Administered 2018-02-21 (×3): 80 ug via INTRAVENOUS

## 2018-02-21 MED ORDER — LIDOCAINE HCL (PF) 1 % IJ SOLN
INTRAMUSCULAR | Status: AC
  Filled 2018-02-21: qty 30

## 2018-02-21 MED ORDER — METHYLENE BLUE 0.5 % INJ SOLN
INTRAVENOUS | Status: AC
  Filled 2018-02-21: qty 10

## 2018-02-21 MED ORDER — FENTANYL CITRATE (PF) 100 MCG/2ML IJ SOLN: 25.0000 ug | INTRAMUSCULAR | Status: DC | PRN

## 2018-02-21 MED ORDER — MIDAZOLAM HCL 2 MG/2ML IJ SOLN
INTRAMUSCULAR | Status: AC
  Filled 2018-02-21: qty 2

## 2018-02-21 MED ORDER — EPHEDRINE SULFATE 50 MG/ML IJ SOLN
INTRAMUSCULAR | Status: DC | PRN
  Administered 2018-02-21 (×5): 10 mg via INTRAVENOUS

## 2018-02-21 MED ORDER — PROPOFOL 10 MG/ML IV BOLUS
INTRAVENOUS | Status: AC
  Filled 2018-02-21: qty 20

## 2018-02-21 MED ORDER — ACETAMINOPHEN 500 MG PO TABS
ORAL_TABLET | ORAL | Status: AC
  Administered 2018-02-21: 1000 mg via ORAL
  Filled 2018-02-21: qty 2

## 2018-02-21 MED ORDER — SODIUM CHLORIDE 0.9 % IJ SOLN
INTRAMUSCULAR | Status: AC
  Filled 2018-02-21: qty 10

## 2018-02-21 MED ORDER — LIDOCAINE 2% (20 MG/ML) 5 ML SYRINGE
INTRAMUSCULAR | Status: DC | PRN
  Administered 2018-02-21: 20 mg via INTRAVENOUS
  Administered 2018-02-21: 40 mg via INTRAVENOUS

## 2018-02-21 MED ORDER — BUPIVACAINE-EPINEPHRINE (PF) 0.25% -1:200000 IJ SOLN
INTRAMUSCULAR | Status: AC
  Filled 2018-02-21: qty 30

## 2018-02-21 MED ORDER — ONDANSETRON HCL 4 MG/2ML IJ SOLN: 4.0000 mg | Freq: Once | INTRAMUSCULAR | Status: DC | PRN

## 2018-02-21 MED ORDER — ACETAMINOPHEN 500 MG PO TABS
1000.0000 mg | ORAL_TABLET | ORAL | Status: AC
  Administered 2018-02-21: 1000 mg via ORAL

## 2018-02-21 MED ORDER — PROPOFOL 10 MG/ML IV BOLUS
INTRAVENOUS | Status: DC | PRN
  Administered 2018-02-21: 150 mg via INTRAVENOUS
  Administered 2018-02-21: 10 mg via INTRAVENOUS

## 2018-02-21 MED ORDER — CEFAZOLIN SODIUM-DEXTROSE 2-4 GM/100ML-% IV SOLN
2.0000 g | INTRAVENOUS | Status: AC
  Administered 2018-02-21: 2 g via INTRAVENOUS

## 2018-02-21 SURGICAL SUPPLY — 54 items
ADH SKN CLS APL DERMABOND .7 (GAUZE/BANDAGES/DRESSINGS) ×1
BINDER BREAST LRG (GAUZE/BANDAGES/DRESSINGS) IMPLANT
BINDER BREAST XLRG (GAUZE/BANDAGES/DRESSINGS) ×2 IMPLANT
BNDG COHESIVE 4X5 TAN STRL (GAUZE/BANDAGES/DRESSINGS) ×3 IMPLANT
CANISTER SUCT 3000ML PPV (MISCELLANEOUS) ×3 IMPLANT
CHLORAPREP W/TINT 26ML (MISCELLANEOUS) ×3 IMPLANT
CLIP VESOCCLUDE LG 6/CT (CLIP) ×3 IMPLANT
CLIP VESOCCLUDE MED 6/CT (CLIP) ×5 IMPLANT
CLIP VESOCCLUDE SM WIDE 6/CT (CLIP) ×3 IMPLANT
CLOSURE WOUND 1/2 X4 (GAUZE/BANDAGES/DRESSINGS) ×1
CONT SPEC 4OZ CLIKSEAL STRL BL (MISCELLANEOUS) IMPLANT
COVER PROBE W GEL 5X96 (DRAPES) ×3 IMPLANT
COVER SURGICAL LIGHT HANDLE (MISCELLANEOUS) ×3 IMPLANT
DERMABOND ADVANCED (GAUZE/BANDAGES/DRESSINGS) ×2
DERMABOND ADVANCED .7 DNX12 (GAUZE/BANDAGES/DRESSINGS) ×1 IMPLANT
DEVICE DUBIN SPECIMEN MAMMOGRA (MISCELLANEOUS) ×2 IMPLANT
DRAPE CHEST BREAST 15X10 FENES (DRAPES) ×3 IMPLANT
DRAPE UNIVERSAL PACK (DRAPES) ×3 IMPLANT
DRAPE UTILITY XL STRL (DRAPES) ×6 IMPLANT
ELECT COATED BLADE 2.86 ST (ELECTRODE) ×3 IMPLANT
ELECT NDL BLADE 2-5/6 (NEEDLE) ×1 IMPLANT
ELECT NEEDLE BLADE 2-5/6 (NEEDLE) ×3 IMPLANT
ELECT REM PT RETURN 9FT ADLT (ELECTROSURGICAL) ×3
ELECTRODE REM PT RTRN 9FT ADLT (ELECTROSURGICAL) ×1 IMPLANT
GLOVE BIO SURGEON STRL SZ 6 (GLOVE) ×3 IMPLANT
GLOVE INDICATOR 6.5 STRL GRN (GLOVE) ×3 IMPLANT
GOWN STRL REUS W/ TWL LRG LVL3 (GOWN DISPOSABLE) ×1 IMPLANT
GOWN STRL REUS W/TWL 2XL LVL3 (GOWN DISPOSABLE) ×3 IMPLANT
GOWN STRL REUS W/TWL LRG LVL3 (GOWN DISPOSABLE) ×3
KIT BASIN OR (CUSTOM PROCEDURE TRAY) ×3 IMPLANT
KIT MARKER MARGIN INK (KITS) ×3 IMPLANT
LIGHT WAVEGUIDE WIDE FLAT (MISCELLANEOUS) ×2 IMPLANT
NDL FILTER BLUNT 18X1 1/2 (NEEDLE) IMPLANT
NDL HYPO 25GX1X1/2 BEV (NEEDLE) ×1 IMPLANT
NDL SAFETY ECLIPSE 18X1.5 (NEEDLE) IMPLANT
NEEDLE FILTER BLUNT 18X 1/2SAF (NEEDLE)
NEEDLE FILTER BLUNT 18X1 1/2 (NEEDLE) IMPLANT
NEEDLE HYPO 18GX1.5 SHARP (NEEDLE)
NEEDLE HYPO 25GX1X1/2 BEV (NEEDLE) ×3 IMPLANT
NS IRRIG 1000ML POUR BTL (IV SOLUTION) ×3 IMPLANT
PACK SURGICAL SETUP 50X90 (CUSTOM PROCEDURE TRAY) ×3 IMPLANT
PENCIL BUTTON HOLSTER BLD 10FT (ELECTRODE) ×3 IMPLANT
SPONGE LAP 18X18 X RAY DECT (DISPOSABLE) ×3 IMPLANT
STOCKINETTE IMPERVIOUS 9X36 MD (GAUZE/BANDAGES/DRESSINGS) ×3 IMPLANT
STRIP CLOSURE SKIN 1/2X4 (GAUZE/BANDAGES/DRESSINGS) ×1 IMPLANT
SUT MNCRL AB 4-0 PS2 18 (SUTURE) ×3 IMPLANT
SUT VIC AB 3-0 SH 8-18 (SUTURE) ×3 IMPLANT
SYR BULB 3OZ (MISCELLANEOUS) ×3 IMPLANT
SYR CONTROL 10ML LL (SYRINGE) ×3 IMPLANT
TOWEL OR 17X24 6PK STRL BLUE (TOWEL DISPOSABLE) ×3 IMPLANT
TOWEL OR 17X26 10 PK STRL BLUE (TOWEL DISPOSABLE) ×3 IMPLANT
TUBE CONNECTING 12'X1/4 (SUCTIONS) ×1
TUBE CONNECTING 12X1/4 (SUCTIONS) ×2 IMPLANT
YANKAUER SUCT BULB TIP NO VENT (SUCTIONS) ×3 IMPLANT

## 2018-02-21 NOTE — H&P (Signed)
[name] Documented: 01/25/2018 7:07 AM Location: [facility] Patient #: [id number] DOB: 1946-03-04 Undefined / Language: [name] / Race: White Female   History of Present Illness Cathy Klein MD; 01/25/2018 4:01 PM) The patient is a 72 year old female who presents with breast cancer. Pt is a 72 yo F with new diagnosis of left breast cancer. This presented as a screening detected left breast disortion. She underwent diagnostic imaging which showed an 7 mm mass at 8 o'clock. 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 This was ER/PR positive, Her 2 negative, and Ki67 was 10%.   Pt is a G3P2 wtih first child in late teens. Menarche was age 49 and menopause was in late 37s. She had three sisters wtih breast cancer, mother wtih stomach cancer, dad wtih leukemia, maternal uncle with leukemia, brother wtih colon cancer. paternal uncle with pancreatic cancer. She has a history of cancer in her father and rectal cancer in her brother.   dx mammogram/us 01/16/18 ACR Breast Density Category c: The breast tissue is heterogeneously dense, which may obscure small masses.  FINDINGS: There is a persistent distortion in the lower-inner quadrant of the left breast, middle to anterior depth.  Mammographic images were processed with CAD.  Ultrasound targeted to the left breast at 8 o'clock, 1 cm from the nipple demonstrates an irregular hypoechoic mass with indistinct margins measuring 7 x 6 x 7 mm. Ultrasound of the left axilla demonstrates multiple normal-appearing lymph nodes.  IMPRESSION: 1. There is a suspicious mass in the left breast at 8 o'clock.  2. No evidence of left axillary lymphadenopathy.   pathology 01/16/18 Diagnosis Breast, left, needle core biopsy, 8 o'clock - INVASIVE DUCTAL CARCINOMA. - DUCTAL CARCINOMA IN SITU. - SEE COMMENT. Microscopic Comment The carcinoma appears grade I. Estrogen Receptor:  100%, POSITIVE, STRONG STAINING INTENSITY Progesterone Receptor: 90%, POSITIVE, STRONG STAINING INTENSITY Proliferation Marker Ki67: 10% Results: HER2 - NEGATIVE (her 2 is not final)   Labs: 7/24 CMET wtih K 3.2 and Cl 95. o/w CBC, CMET essentially normal    Past Surgical History Cathy Klein, MD; 01/25/2018 7:07 AM) Gallbladder Surgery - Laparoscopic  Hysterectomy (due to cancer) - Complete   Diagnostic Studies History Cathy Klein, MD; 01/25/2018 7:07 AM) Colonoscopy  1-5 years ago Mammogram  1-3 years ago Pap Smear  1-5 years ago  Social History Cathy Klein, MD; 01/25/2018 7:07 AM) Caffeine use  Carbonated beverages, Coffee. No alcohol use  No drug use  Tobacco use  Never smoker.  Family History Cathy Klein, MD; 01/25/2018 7:07 AM) Cancer  Father. Rectal Cancer  Brother.  Pregnancy / Birth History Cathy Klein, MD; 01/25/2018 7:07 AM) Age at menarche  21 years. Age of menopause  69-50 Gravida  3 Maternal age  85-20 Para  2  Other Problems Cathy Klein, MD; 01/25/2018 7:07 AM) Back Pain  Cholelithiasis  High blood pressure  Lump In Breast     Review of Systems Cathy Klein MD; 01/25/2018 7:07 AM) General Not Present- Appetite Loss, Chills, Fatigue, Fever, Night Sweats, Weight Gain and Weight Loss. HEENT Not Present- Earache, Hearing Loss, Hoarseness, Nose Bleed, Oral Ulcers, Ringing in the Ears, Seasonal Allergies, Sinus Pain, Sore Throat, Visual Disturbances, Wears glasses/contact lenses and Yellow Eyes. Breast Present- Skin Changes. Not Present- Breast Mass, Breast Pain and Nipple Discharge. Cardiovascular Not Present- Chest Pain, Difficulty Breathing Lying Down, Leg Cramps, Palpitations, Rapid Heart Rate, Shortness of Breath and Swelling of Extremities. Gastrointestinal Not Present-  Abdominal Pain, Bloating, Bloody Stool, Change in Bowel Habits, Chronic diarrhea, Constipation, Difficulty Swallowing, Excessive gas, Gets full quickly at  meals, Hemorrhoids, Indigestion, Nausea, Rectal Pain and Vomiting. Female Genitourinary Present- Frequency and Nocturia. Not Present- Painful Urination, Pelvic Pain and Urgency. Musculoskeletal Present- Back Pain. Not Present- Joint Pain, Joint Stiffness, Muscle Pain, Muscle Weakness and Swelling of Extremities. Neurological Not Present- Decreased Memory, Fainting, Headaches, Numbness, Seizures, Tingling, Tremor, Trouble walking and Weakness. Psychiatric Not Present- Anxiety, Bipolar, Change in Sleep Pattern, Depression, Fearful and Frequent crying. Endocrine Not Present- Cold Intolerance, Excessive Hunger, Hair Changes, Heat Intolerance, Hot flashes and New Diabetes. Hematology Present- Easy Bruising. Not Present- Blood Thinners, Excessive bleeding, Gland problems, HIV and Persistent Infections.   Physical Exam Cathy Klein MD; 01/25/2018 4:02 PM) General Mental Status-Alert. General Appearance-Consistent with stated age. Hydration-Well hydrated. Voice-Normal.  Head and Neck Head-normocephalic, atraumatic with no lesions or palpable masses. Trachea-midline. Thyroid Gland Characteristics - normal size and consistency.  Eye Eyeball - Bilateral-Extraocular movements intact. Sclera/Conjunctiva - Bilateral-No scleral icterus.  Chest and Lung Exam Chest and lung exam reveals -quiet, even and easy respiratory effort with no use of accessory muscles and on auscultation, normal breath sounds, no adventitious sounds and normal vocal resonance. Inspection Chest Wall - Normal. Back - normal.  Breast Note: breasts a-b cup size. symmetric bilaterally. bruising on breast. No palpable mass. no skin simpling or nipple retraction. No LAD.   Cardiovascular Cardiovascular examination reveals -normal heart sounds, regular rate and rhythm with no murmurs and normal pedal pulses bilaterally.  Abdomen Inspection Inspection of the abdomen reveals - No  Hernias. Palpation/Percussion Palpation and Percussion of the abdomen reveal - Soft, Non Tender, No Rebound tenderness, No Rigidity (guarding) and No hepatosplenomegaly. Auscultation Auscultation of the abdomen reveals - Bowel sounds normal.  Neurologic Neurologic evaluation reveals -alert and oriented x 3 with no impairment of recent or remote memory. Mental Status-Normal.  Musculoskeletal Global Assessment -Note: no gross deformities.  Normal Exam - Left-Upper Extremity Strength Normal and Lower Extremity Strength Normal. Normal Exam - Right-Upper Extremity Strength Normal and Lower Extremity Strength Normal.  Lymphatic Head & Neck  General Head & Neck Lymphatics: Bilateral - Description - Normal. Axillary  General Axillary Region: Bilateral - Description - Normal. Tenderness - Non Tender. Femoral & Inguinal  Generalized Femoral & Inguinal Lymphatics: Bilateral - Description - No Generalized lymphadenopathy.    Assessment & Plan Cathy Klein MD; 01/25/2018 4:04 PM) MALIGNANT NEOPLASM OF LOWER-INNER QUADRANT OF LEFT BREAST IN FEMALE, ESTROGEN RECEPTOR POSITIVE (C50.312) Impression: Pt has a new diagnosis of cT1bN0 left breast cancer. She will need to see genetics. We will also get an MRI due to her family history and breast density. She is a good candidate for a seed localized lumpectomy/SLN bx as long as no surprises seen on MRI.  This would be followed by oncotype/mammaprint and external beam radiation. She will need antihormone treatment.  The surgical procedure was described to the patient. I discussed the incision type and location and that we would need radiology involved on with a wire or seed marker and/or sentinel node.  The risks and benefits of the procedure were described to the patient and she wishes to proceed.  We discussed the risks bleeding, infection, damage to other structures, need for further procedures/surgeries. We discussed the risk of  seroma. The patient was advised if the area in the breast in cancer, we may need to go back to surgery for additional tissue to obtain negative margins or for a  lymph node biopsy. The patient was advised that these are the most common complications, but that others can occur as well. They were advised against taking aspirin or other anti-inflammatory agents/blood thinners the week before surgery. Current Plans You are being scheduled for surgery- Our schedulers will call you.  You should hear from our office's scheduling department within 5 working days about the location, date, and time of surgery. We try to make accommodations for patient's preferences in scheduling surgery, but sometimes the OR schedule or the surgeon's schedule prevents [place] from making those accommodations.  If you have not heard from our office [phone]) in 5 working days, call the office and ask for your surgeon's nurse.  If you have other questions about your diagnosis, plan, or surgery, call the office and ask for your surgeon's nurse.  Pt Education - [facility] breast cancer surgery: discussed with patient and provided information.   Signed by Cathy Klein, MD (01/25/2018 4:05 PM)

## 2018-02-21 NOTE — Anesthesia Procedure Notes (Signed)
Anesthesia Regional Block: Pectoralis block   Pre-Anesthetic Checklist: ,, timeout performed, Correct Patient, Correct Site, Correct Laterality, Correct Procedure, Correct Position, site marked, Risks and benefits discussed,  Surgical consent,  Pre-op evaluation,  At surgeon's request and post-op pain management  Laterality: Left  Prep: chloraprep       Needles:  Injection technique: Single-shot  Needle Type: Echogenic Stimulator Needle     Needle Length: 9cm  Needle Gauge: 21     Additional Needles:   Procedures:,,,, ultrasound used (permanent image in chart),,,,  Narrative:  Start time: 02/21/2018 7:20 AM End time: 02/21/2018 7:30 AM Injection made incrementally with aspirations every 5 mL.  Performed by: Personally  Anesthesiologist: Murvin Natal, MD  Additional Notes: Functioning IV was confirmed and monitors were applied.  A 61mm 21ga Arrow echogenic stimulator needle was used. Sterile prep, hand hygiene and sterile gloves were used.  Negative aspiration and negative test dose prior to incremental administration of local anesthetic. The patient tolerated the procedure well.  Ultrasound guidance: relevent anatomy identified, needle position confirmed, local anesthetic spread visualized around nerve(s), vascular puncture avoided.  Image printed for medical record.

## 2018-02-21 NOTE — Anesthesia Postprocedure Evaluation (Signed)
Anesthesia Post Note  Patient: [name]  Procedure(s) Performed: BREAST LUMPECTOMY WITH RADIOACTIVE SEED AND SENTINEL LYMPH NODE BIOPSY (Left Breast)     Patient location during evaluation: PACU Anesthesia Type: General and Regional Level of consciousness: awake and alert Pain management: pain level controlled Vital Signs Assessment: post-procedure vital signs reviewed and stable Respiratory status: spontaneous breathing, nonlabored ventilation, respiratory function stable and patient connected to nasal cannula oxygen Cardiovascular status: blood pressure returned to baseline and stable Postop Assessment: no apparent nausea or vomiting Anesthetic complications: no    Last Vitals:  Vitals:   02/21/18 1045 02/21/18 1049  BP:    Pulse: 80 78  Resp: 13 15  Temp: (!) 36.2 C   SpO2: 93% 93%    Last Pain:  Vitals:   02/21/18 1045  TempSrc:   PainSc: 0-No pain                 Ryan P Ellender

## 2018-02-21 NOTE — Transfer of Care (Signed)
Immediate Anesthesia Transfer of Care Note  Patient: [name]  Procedure(s) Performed: BREAST LUMPECTOMY WITH RADIOACTIVE SEED AND SENTINEL LYMPH NODE BIOPSY (Left Breast)  Patient Location: PACU  Anesthesia Type:General and Regional  Level of Consciousness: awake, patient cooperative and responds to stimulation  Airway & Oxygen Therapy: Patient Spontanous Breathing  Post-op Assessment: Report given to RN, Post -op Vital signs reviewed and stable and Patient moving all extremities X 4  Post vital signs: Reviewed and stable  Last Vitals:  Vitals Value Taken Time  BP 138/80 02/21/2018  9:50 AM  Temp    Pulse 73 02/21/2018  9:52 AM  Resp 16 02/21/2018  9:52 AM  SpO2 95 % 02/21/2018  9:52 AM  Vitals shown include unvalidated device data.  Last Pain:  Vitals:   02/21/18 0950  TempSrc:   PainSc: (P) 0-No pain         Complications: No apparent anesthesia complications

## 2018-02-21 NOTE — Discharge Instructions (Signed)
[facility] °Office Phone Number [phone] ° °BREAST BIOPSY/ PARTIAL MASTECTOMY: POST OP INSTRUCTIONS ° °Always review your discharge instruction sheet given to you by the facility where your surgery was performed. ° °IF YOU HAVE DISABILITY OR FAMILY LEAVE FORMS, YOU MUST BRING THEM TO THE OFFICE FOR PROCESSING.  DO NOT GIVE THEM TO YOUR DOCTOR. ° °1. A prescription for pain medication may be given to you upon discharge.  Take your pain medication as prescribed, if needed.  If narcotic pain medicine is not needed, then you may take acetaminophen (Tylenol) or ibuprofen (Advil) as needed. °2. Take your usually prescribed medications unless otherwise directed °3. If you need a refill on your pain medication, please contact your pharmacy.  They will contact our office to request authorization.  Prescriptions will not be filled after 5pm or on week-ends. °4. You should eat very light the first 24 hours after surgery, such as soup, crackers, pudding, etc.  Resume your normal diet the day after surgery. °5. Most patients will experience some swelling and bruising in the breast.  Ice packs and a good support bra will help.  Swelling and bruising can take several days to resolve.  °6. It is common to experience some constipation if taking pain medication after surgery.  Increasing fluid intake and taking a stool softener will usually help or prevent this problem from occurring.  A mild laxative (Milk of Magnesia or Miralax) should be taken according to package directions if there are no bowel movements after 48 hours. °7. Unless discharge instructions indicate otherwise, you may remove your bandages 48 hours after surgery, and you may shower at that time.  You may have steri-strips (small skin tapes) in place directly over the incision.  These strips should be left on the skin for 7-10 days.   Any sutures or staples will be removed at the office during your follow-up visit. °8. ACTIVITIES:  You may resume  regular daily activities (gradually increasing) beginning the next day.  Wearing a good support bra or sports bra (or the breast binder) minimizes pain and swelling.  You may have sexual intercourse when it is comfortable. °a. You may drive when you no longer are taking prescription pain medication, you can comfortably wear a seatbelt, and you can safely maneuver your car and apply brakes. °b. RETURN TO WORK:  __________1 week_______________ °9. You should see your doctor in the office for a follow-up appointment approximately two weeks after your surgery.  Your doctor’s nurse will typically make your follow-up appointment when she calls you with your pathology report.  Expect your pathology report 2-3 business days after your surgery.  You may call to check if you do not hear from us after three days. ° ° °WHEN TO CALL YOUR DOCTOR: °1. Fever over 101.0 °2. Nausea and/or vomiting. °3. Extreme swelling or bruising. °4. Continued bleeding from incision. °5. Increased pain, redness, or drainage from the incision. ° °The clinic staff is available to answer your questions during regular business hours.  Please don’t hesitate to call and ask to speak to one of the nurses for clinical concerns.  If you have a medical emergency, go to the nearest emergency room or call 911.  A surgeon from [facility] is always on call at the hospital. ° °For further questions, please visit [email or web address]  ° °

## 2018-02-21 NOTE — Op Note (Signed)
Left Breast Radioactive seed localized lumpectomy and sentinel lymph node biopsy  Indications: This patient presents with history of left breast cancer, lower inner quadrant, grade 1 invasive ductal carcinoma.  +/+/-, Ki 67 10%. cT1bN0  Pre-operative Diagnosis: left breast cancer  Post-operative Diagnosis: left breast cancer  Surgeon: Stark Klein   Anesthesia: General endotracheal anesthesia  ASA Class: 2  Procedure Details  The patient was seen in the Holding Room. The risks, benefits, complications, treatment options, and expected outcomes were discussed with the patient. The possibilities of bleeding, infection, the need for additional procedures, failure to diagnose a condition, and creating a complication requiring transfusion or operation were discussed with the patient. The patient concurred with the proposed plan, giving informed consent.  The site of surgery properly noted/marked. The patient was taken to Operating Room # 9, identified, and the procedure verified as Left Breast Seed Localized Lumpectomy with sentinel lymph node biopsy. A Time Out was held and the above information confirmed.  The left arm, breast, and chest were prepped and draped in standard fashion. The lumpectomy was performed by creating a inferomedial circumareolar incision located near the previously placed radioactive seed.  Dissection was carried down to around the point of maximum signal intensity. The cautery was used to perform the dissection.  Hemostasis was achieved with cautery. The edges of the cavity were marked with large clips, with one each medial, lateral, inferior and superior, and two clips posteriorly.   The specimen was inked with the margin marker paint kit.    Specimen radiography confirmed inclusion of the mammographic lesion, the clip, and the seed.  The background signal in the breast was zero.  The wound was irrigated and closed with 3-0 vicryl in layers and 4-0 monocryl subcuticular suture.     Using a hand-held gamma probe, left axillary sentinel nodes were identified transcutaneously.  An oblique incision was created below the axillary hairline.  Dissection was carried through the clavipectoral fascia.  Two deep level 2 axillary sentinel nodes were removed.  Counts per second were 280 and 180 .    The background count was 12 cps.  The wound was irrigated.  Hemostasis was achieved with cautery.  The axillary incision was closed with a 3-0 vicryl deep dermal interrupted sutures and a 4-0 monocryl subcuticular closure.    Sterile dressings were applied. At the end of the operation, all sponge, instrument, and needle counts were correct.  Findings: grossly clear surgical margins and no adenopathy, posterior margin is pectoralis Estimated Blood Loss:  min         Specimens: left breast lumpectomy and two deep axillary sentinel lymph nodes.             Complications:  None; patient tolerated the procedure well.         Disposition: PACU - hemodynamically stable.         Condition: stable

## 2018-02-21 NOTE — Interval H&P Note (Signed)
History and Physical Interval Note:  02/21/2018 7:44 AM  [name]  has presented today for surgery, with the diagnosis of LEFT BREAST CANCER  The various methods of treatment have been discussed with the patient and family. After consideration of risks, benefits and other options for treatment, the patient has consented to  Procedure(s): BREAST LUMPECTOMY WITH RADIOACTIVE SEED AND SENTINEL LYMPH NODE BIOPSY (Left) as a surgical intervention .  The patient's history has been reviewed, patient examined, no change in status, stable for surgery.  I have reviewed the patient's chart and labs.  Questions were answered to the patient's satisfaction.     Stark Klein

## 2018-02-22 ENCOUNTER — Encounter (HOSPITAL_COMMUNITY): Payer: Self-pay | Admitting: General Surgery

## 2018-02-23 NOTE — Progress Notes (Signed)
Please let patient know margins and lymph nodes are negative.

## 2018-02-24 ENCOUNTER — Telehealth: Payer: Self-pay | Admitting: *Deleted

## 2018-02-24 NOTE — Telephone Encounter (Signed)
Received oncotype testing order. Requisition faxed to pathology. Received by Varney Biles.

## 2018-03-03 ENCOUNTER — Telehealth: Payer: Self-pay | Admitting: *Deleted

## 2018-03-03 DIAGNOSIS — Z170 Estrogen receptor positive status [ER+]: Principal | ICD-10-CM

## 2018-03-03 DIAGNOSIS — C50312 Malignant neoplasm of lower-inner quadrant of left female breast: Secondary | ICD-10-CM

## 2018-03-03 NOTE — Telephone Encounter (Signed)
Received oncotype score of 18/5%. Physician team notified. Called pt with results and discussed chemotherapy is not recommended. Informed next step is xrt with Dr. Sondra Come. Referral placed

## 2018-03-07 ENCOUNTER — Encounter: Payer: Self-pay | Admitting: Hematology

## 2018-03-08 ENCOUNTER — Encounter (HOSPITAL_COMMUNITY): Payer: Self-pay | Admitting: Hematology

## 2018-03-10 NOTE — Progress Notes (Signed)
Location of Breast Cancer: lower-inner quadrant, LEFT breast  Histology per Pathology Report: 02/21/18:  1. Breast, lumpectomy, Left - INVASIVE AND IN SITU DUCTAL CARCINOMA, 1.2 CM, GRADE I. - MARGINS NOT INVOLVED. - PREVIOUS BIOPSY SITE AND BIOPSY CLIP. - FIBROCYSTIC CHANGES WITH USUAL DUCTAL HYPERPLASIA. 2. Lymph node, sentinel, biopsy, Left Axillary #1 - ONE BENIGN LYMPH NODE (0/1). 3. Lymph node, sentinel, biopsy, Left Axillary #2 - ONE BENIGN LYMPH NODE (0/1). Microscopic Comment 1. INVASIVE CARCINOMA OF THE BREAST: Resection Procedure: Localization lumpectomy and two sentinel lymph nodes Specimen Laterality: Left breast Tumor Size: 1.2 cm Histologic Type: Ductal Histologic Grade: I Glandular (Acinar)/Tubular Differentiation 2 Nuclear Pleomorphism: 2 Mitotic Rate: 1 Overall Grade: 5 Ductal Carcinoma In Situ: Present Tumor Extension: N/A Margins: Free of tumor Distance from closest margin (millimeters): 0.3 cm from anterior lateral junction Specify closest margin (required only if <8m): Anterior lateral junction DCIS Margins Cannot be determined Regional Lymph Nodes: Number of Lymph Nodes Examined: 2 Number of Sentinel Nodes Examined (if applicable): 2 Number of Lymph Nodes with Macrometastases (>2 mm): 0  01/16/18:  Breast, left, needle core biopsy, 8 o'clock - INVASIVE DUCTAL CARCINOMA. - DUCTAL CARCINOMA IN SITU. - SEE COMMENT. Results: IMMUNOHISTOCHEMICAL AND MORPHOMETRIC ANALYSIS PERFORMED MANUALLY Estrogen Receptor: 100%, POSITIVE, STRONG STAINING INTENSITY Progesterone Receptor: 90%, POSITIVE, STRONG STAINING INTENSITY Proliferation Marker Ki67: 10%  Receptor Status: ER(100%, strong), PR (90%, strong), Her2-neu (negative), Ki-(10%)  Did patient present with symptoms (if so, please note symptoms) or was this found on screening mammography?:  On 01/16/2018 she underwent a Diagnostic unilateral left breast mammogram with CAD and TOMO and left breast ultrasound,  breast density category C, showing a suspicious mass in the breast at 8 o'clock. No evidence of left axillary lymphadenopathy.   Past/Anticipated interventions by surgeon, if any: 02/21/18: Left Breast Radioactive seed localized lumpectomy and sentinel lymph node biopsy Surgeon: BStark Klein   Past/Anticipated interventions by medical oncology, if any: Chemotherapy Per Dr. FBurr Medico7/24/19:  PLAN:  -Send genetic referral  -she will proceed left breast lumpectomy and sentinel lymph node biopsy by Dr. BBarry Dienessoon -if tumor >1cm will send Oncotype  -F/u afte radiation or sooner if needed   Lymphedema issues, if any: States that they Surgeon said that she had some fluid in her breast ,but the surgeon wants Dr. KSondra Cometo assess  To see what he thinks.  Pain issues, if any:  No  SAFETY ISSUES:  Prior radiation? No  Pacemaker/ICD? No  Possible current pregnancy?No  Is the patient on methotrexate? No  Current Complaints / other details: None Vitals:   03/15/18 1441 03/15/18 1444  BP: (!) 143/82 (!) 143/82  Pulse: 70 70  Resp: 20 20  Temp: 98.2 F (36.8 C) 98.2 F (36.8 C)  TempSrc: Oral Oral  SpO2: 100% 100%  Weight: 157 lb 3.2 oz (71.3 kg) 157 lb 3.2 oz (71.3 kg)  Height: 5' 6.5" (1.689 m)    Wt Readings from Last 3 Encounters:  03/15/18 157 lb 3.2 oz (71.3 kg)  02/21/18 156 lb 12.8 oz (71.1 kg)  02/15/18 156 lb 12.8 oz (71.1 kg)         JLoma Sousa RN 03/10/2018,2:48 PM

## 2018-03-15 ENCOUNTER — Ambulatory Visit
Admission: RE | Admit: 2018-03-15 | Discharge: 2018-03-15 | Disposition: A | Discharge status: Home / Self Care | Payer: Medicare Other | Source: Ambulatory Visit | Attending: Radiation Oncology | Admitting: Radiation Oncology

## 2018-03-15 ENCOUNTER — Other Ambulatory Visit: Payer: Self-pay

## 2018-03-15 ENCOUNTER — Encounter: Payer: Self-pay | Admitting: Radiation Oncology

## 2018-03-15 VITALS — BP 143/82 | HR 70 | Temp 98.2°F | Resp 20 | Ht 66.5 in | Wt 157.2 lb

## 2018-03-15 DIAGNOSIS — Z79899 Other long term (current) drug therapy: Secondary | ICD-10-CM | POA: Diagnosis not present

## 2018-03-15 DIAGNOSIS — Z170 Estrogen receptor positive status [ER+]: Secondary | ICD-10-CM | POA: Diagnosis not present

## 2018-03-15 DIAGNOSIS — Z9221 Personal history of antineoplastic chemotherapy: Secondary | ICD-10-CM | POA: Insufficient documentation

## 2018-03-15 DIAGNOSIS — C50312 Malignant neoplasm of lower-inner quadrant of left female breast: Secondary | ICD-10-CM | POA: Diagnosis not present

## 2018-03-15 NOTE — Progress Notes (Signed)
Radiation Oncology         [phone] ________________________________  Name: [name] MRN: [id number]  Date: 03/15/2018  DOB: [birth year]  Follow-Up Visit Note  CC: Burnard Bunting, MD  Burnard Bunting, MD    ICD-10-CM   1. Malignant neoplasm of lower-inner quadrant of left breast in female, estrogen receptor positive ([place]) C50.312    Z17.0     Diagnosis:  Clinical Stage T1b,Nx,mx left Breast LIQ Invasive Ductal Carcinoma, ER+ / PR+ / Her2-, Grade 1  Pathologic stage: pT1c, pN0  Narrative:  The patient returns today for routine follow-up following her last visit to the office during Breast Clinic on 01/25/2018.  she is doing well overall. She notes that her surgery went well and she has had intermittent pain to her surgical site. She has followed up with her surgeon, Dr. Barry Dienes since her surgery. She will have a follow up appointment with her Medical Oncologist, Dr. Burr Medico in the near future. She denies any other symptoms.   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 No evidence of multifocal, multicentric or contralateral malignancy. No abnormal lymph nodes.  She also had left lumpectomy completed on 02/21/2018 that showed: Breast, lumpectomy, Left with invasive and in situ ductal carcinoma, 1.2 cm, grade I. Margins not involved. Previous biopsy site and biopsy clip. Fibrocystic changes with usual ductal hyperplasia. Lymph node, sentinel, biopsy, Left Axillary #1 with one benign lymph node (0/1). Lymph node, sentinel, biopsy, Left Axillary #2 with one benign lymph node (0/1).   On 02/21/2018, she had an Oncotype DX completed with results of: The  Oncotype DX score was 18 predicting a risk of outside the breast recurrence over the next 9 years of 5% if the patient's only systemic therapy is tamoxifen for 5 years. It also predicts no benefit from chemotherapy.                 ALLERGIES:  has No Known Allergies.  Meds: Current Outpatient Medications  Medication Sig Dispense Refill  . ARIPiprazole (ABILIFY) 5 MG tablet Take 5 mg by mouth daily. In the morning.    . Ascorbic Acid (VITAMIN C) 1000 MG tablet Take 1,000 mg by mouth daily.    . carvedilol (COREG) 12.5 MG tablet Take 12.5 mg by mouth daily.     . DULoxetine (CYMBALTA) 60 MG capsule Take 60 mg by mouth daily.    Marland Kitchen ibuprofen (ADVIL,MOTRIN) 200 MG tablet Take 400 mg by mouth every 8 (eight) hours as needed for mild pain (for pain .).     Marland Kitchen loratadine (CLARITIN) 10 MG tablet Take 10 mg by mouth daily.    Marland Kitchen LORazepam (ATIVAN) 1 MG tablet Take 1 mg by mouth every 8 (eight) hours as needed for anxiety.     . meloxicam (MOBIC) 7.5 MG tablet Take 15 mg by mouth daily.  4  . olmesartan-hydrochlorothiazide (BENICAR HCT) 40-25 MG tablet Take 1 tablet by mouth at bedtime.     Marland Kitchen omeprazole (PRILOSEC) 40 MG capsule Take 40 mg by mouth daily.    . Probiotic Product (PROBIOTIC PO) Take 1 capsule by mouth daily.    . traZODone (  DESYREL) 50 MG tablet Take 50 mg by mouth at bedtime.    Marland Kitchen oxyCODONE (OXY IR/ROXICODONE) 5 MG immediate release tablet Take 0.5-1 tablets (2.5-5 mg total) by mouth every 4 (four) hours as needed for severe pain. (Patient not taking: Reported on 03/15/2018) 10 tablet 0   No current facility-administered medications for this encounter.     Physical Findings: The patient is in no acute distress. Patient is alert and oriented.  height is 5' 6.5" (1.689 m) and weight is 157 lb 3.2 oz (71.3 kg). Her oral temperature is 98.2 F (36.8 C). Her blood pressure is 143/82 (abnormal) and her pulse is 70. Her respiration is 20 and oxygen saturation is 100%.   Lungs are clear to  auscultation bilaterally. Heart has regular rate and rhythm. No palpable cervical, supraclavicular, or axillary adenopathy. Abdomen soft, non-tender, normal bowel sounds. Right breast with no palpable mass, nipple discharge, or bleeding. Left breast with periareolar scar in the LIQ which is healing well without signs of drainage or infection. Patient has a separate scar in the axillary region from her sentinel node procedure.  Extremities: Good ROM in her left arm and shoulder    Lab Findings: Lab Results  Component Value Date   WBC 8.8 02/15/2018   HGB 13.3 02/21/2018   HCT 39.0 02/21/2018   MCV 80.8 02/15/2018   PLT 257 02/15/2018    Radiographic Findings: Nm Sentinel Node Inj-no Rpt (breast)  Result Date: 02/21/2018 Sulfur colloid was injected by the nuclear medicine technologist for melanoma sentinel node.   Mm Breast Surgical Specimen  Result Date: 02/21/2018 CLINICAL DATA:  Recent diagnosis of LEFT breast cancer status post lumpectomy today after earlier radioactive seed localization. EXAM: SPECIMEN RADIOGRAPH OF THE LEFT BREAST COMPARISON:  Previous exam(s). FINDINGS: Status post excision of the left breast. The wire tip and biopsy marker clip are present and are marked for pathology. The locations of the radioactive seed and biopsy marker clip within the specimen were discussed with the OR staff during the procedure. IMPRESSION: Specimen radiograph of the left breast. Electronically Signed   By: Franki Cabot M.D.   On: 02/21/2018 09:14   Mm Lt Radioactive Seed Loc Mammo Guide  Result Date: 02/20/2018 CLINICAL DATA:  72 year old patient with recent diagnosis of left breast cancer. She presents for radioactive seed localization. EXAM: MAMMOGRAPHIC GUIDED RADIOACTIVE SEED LOCALIZATION OF THE LEFT BREAST COMPARISON:  Previous exam(s). FINDINGS: Patient presents for radioactive seed localization prior to lumpectomy. I met with the patient and we discussed the procedure of seed  localization including benefits and alternatives. We discussed the high likelihood of a successful procedure. We discussed the risks of the procedure including infection, bleeding, tissue injury and further surgery. We discussed the low dose of radioactivity involved in the procedure. Informed, written consent was given. The usual time-out protocol was performed immediately prior to the procedure. Using mammographic guidance, sterile technique, 1% lidocaine and an I-125 radioactive seed, ribbon shaped biopsy clip and mass was localized using a medial approach. The follow-up mammogram images confirm the seed in the expected location and were marked for Dr. Barry Dienes. Follow-up survey of the patient confirms presence of the radioactive seed. Order number of I-125 seed:  [id number]. Total activity: 0.248 mCi reference Date: 31 January 2018 The patient tolerated the procedure well and was released from the [facility]. She was given instructions regarding seed removal. IMPRESSION: Radioactive seed localization left breast. No apparent complications. Electronically Signed   By: Audelia Acton.D.  On: 02/20/2018 16:12    Impression: pT1c, pN0, left Breast LIQ Invasive Ductal Carcinoma, ER+ / PR+ / Her2-, Grade 1  Patient would be a good candidate for breast conservation with radiation therapy directed to the left breast. I discussed with the patient that given her excellent prognosis, she will likely do well with adjuvant hormonal therapy alone at this point, however, she does wish to be aggressive and proceed with radiation and adjuvant hormonal therapy.   Today, I talked to the patient about the findings and work-up thus far.  We discussed the natural history of left breast cancer and general treatment, highlighting the role of radiotherapy in the management.  We discussed the available radiation techniques, and focused on the details of logistics and delivery.  We reviewed the anticipated acute and late sequelae  associated with radiation in this setting.  The patient was encouraged to ask questions that I answered to the best of my ability.  A patient consent form was discussed and signed.  We retained a copy for our records.  The patient would like to proceed with radiation and will be scheduled for CT simulation.  Plan:  Patient will return tomorrow for a CT simulation and treatments to begin approximately ~5 weeks post op. Patient would appear to be a good candidate for hypo-fractionated accelerated radiation therapy.   ____________________________________   Blair Promise, PhD, MD    This document serves as a record of services personally performed by Gery Pray, MD. It was created on his behalf by [facility], a trained medical scribe. The creation of this record is based on the scribe's personal observations and the provider's statements to them. This document has been checked and approved by the attending provider.

## 2018-03-16 ENCOUNTER — Ambulatory Visit
Admission: RE | Admit: 2018-03-16 | Discharge: 2018-03-16 | Disposition: A | Discharge status: Home / Self Care | Payer: Medicare Other | Source: Ambulatory Visit | Attending: Radiation Oncology | Admitting: Radiation Oncology

## 2018-03-16 DIAGNOSIS — Z510 Encounter for antineoplastic radiation therapy: Secondary | ICD-10-CM | POA: Insufficient documentation

## 2018-03-16 DIAGNOSIS — C50312 Malignant neoplasm of lower-inner quadrant of left female breast: Secondary | ICD-10-CM | POA: Diagnosis not present

## 2018-03-16 DIAGNOSIS — Z170 Estrogen receptor positive status [ER+]: Secondary | ICD-10-CM | POA: Insufficient documentation

## 2018-03-16 NOTE — Progress Notes (Signed)
  Radiation Oncology         (336) [phone] ________________________________  Name: [name] MRN: [id number]  Date: 03/16/2018  DOB: 11-19-1945  SIMULATION AND TREATMENT PLANNING NOTE   DIAGNOSIS:  ClinicalStageT1b,Nx,mx leftBreast LIQ Invasive Ductal Carcinoma, ER+/ PR+/ Her2-, Grade1  Pathologic stage: pT1c, pN0   NARRATIVE:  The patient was brought to the [facility].  Identity was confirmed.  All relevant records and images related to the planned course of therapy were reviewed.  The patient freely provided informed written consent to proceed with treatment after reviewing the details related to the planned course of therapy. The consent form was witnessed and verified by the simulation staff.  Then, the patient was set-up in a stable reproducible  supine position for radiation therapy.  CT images were obtained.  Surface markings were placed.  The CT images were loaded into the planning software.  Then the target and avoidance structures were contoured.  Treatment planning then occurred.  The radiation prescription was entered and confirmed.  Then, I designed and supervised the construction of a total of 3 medically necessary complex treatment devices.  I have requested : 3D Simulation  I have requested a DVH of the following structures: heart, lungs, and lumpectomy cavity.  I have ordered: dose calculation.  PLAN:  The patient will receive 40.05 Gy in 15 fractions followed by a boost to the lumpectomy cavity of 10 Gy in 5 fractions.   Optical Surface Tracking Plan:  Since intensity modulated radiotherapy (IMRT) and 3D conformal radiation treatment methods are predicated on accurate and precise positioning for treatment, intrafraction motion monitoring is medically necessary to ensure accurate and safe treatment delivery.  The ability to quantify intrafraction motion without excessive ionizing radiation dose can only be performed with optical surface tracking.  Accordingly, surface imaging offers the opportunity to obtain 3D measurements of patient position throughout IMRT and 3D treatments without excessive radiation exposure.  I am ordering optical surface tracking for this patient's upcoming course of radiotherapy. ________________________________   -----------------------------------  Blair Promise, PhD, MD  This document serves as a record of services personally performed by Gery Pray, MD. It was created on his behalf by Wilburn Mylar, a trained medical scribe. The creation of this record is based on the scribe's personal observations and the provider's statements to them. This document has been checked and approved by the attending provider.

## 2018-03-21 DIAGNOSIS — C50312 Malignant neoplasm of lower-inner quadrant of left female breast: Secondary | ICD-10-CM | POA: Diagnosis not present

## 2018-03-23 ENCOUNTER — Other Ambulatory Visit: Payer: Self-pay

## 2018-03-23 ENCOUNTER — Ambulatory Visit
Admission: RE | Admit: 2018-03-23 | Discharge: 2018-03-23 | Disposition: A | Discharge status: Home / Self Care | Payer: Medicare Other | Source: Ambulatory Visit | Attending: Radiation Oncology | Admitting: Radiation Oncology

## 2018-03-23 ENCOUNTER — Ambulatory Visit: Payer: Medicare Other | Attending: General Surgery | Admitting: Physical Therapy

## 2018-03-23 ENCOUNTER — Encounter: Payer: Self-pay | Admitting: Physical Therapy

## 2018-03-23 DIAGNOSIS — R293 Abnormal posture: Secondary | ICD-10-CM | POA: Diagnosis present

## 2018-03-23 DIAGNOSIS — Z483 Aftercare following surgery for neoplasm: Secondary | ICD-10-CM | POA: Insufficient documentation

## 2018-03-23 DIAGNOSIS — C50312 Malignant neoplasm of lower-inner quadrant of left female breast: Secondary | ICD-10-CM

## 2018-03-23 DIAGNOSIS — Z170 Estrogen receptor positive status [ER+]: Principal | ICD-10-CM

## 2018-03-23 NOTE — Progress Notes (Signed)
  Radiation Oncology         (336) [phone] ________________________________  Name: [name] MRN: [id number]  Date: 03/23/2018  DOB: [birth year]  Simulation Verification Note    ICD-10-CM   1. Malignant neoplasm of lower-inner quadrant of left breast in female, estrogen receptor positive ([place]) C50.312    Z17.0      ClinicalStageT1b,Nx,mx leftBreast LIQ Invasive Ductal Carcinoma, ER+/ PR+/ Her2-, Grade1  Pathologic stage: pT1c, pN0  Status: outpatient  NARRATIVE: The patient was brought to the treatment unit and placed in the planned treatment position. The clinical setup was verified. Then port films were obtained and uploaded to the radiation oncology medical record software.  The treatment beams were carefully compared against the planned radiation fields. The position location and shape of the radiation fields was reviewed. They targeted volume of tissue appears to be appropriately covered by the radiation beams. Organs at risk appear to be excluded as planned.  Based on my personal review, I approved the simulation verification. The patient's treatment will proceed as planned.  -----------------------------------  Blair Promise, PhD, MD  This document serves as a record of services personally performed by Gery Pray, MD. It was created on his behalf by Wilburn Mylar, a trained medical scribe. The creation of this record is based on the scribe's personal observations and the provider's statements to them. This document has been checked and approved by the attending provider.

## 2018-03-23 NOTE — Therapy (Signed)
[facility] [address] [place], [place], [address] Phone: [phone]   Fax:  [phone]  Physical Therapy Treatment  Patient Details  Name: [name] MRN: [id number] Date of Birth: 08/24/1945 Referring Provider: Dr. Faera Byerly   Encounter Date: 03/23/2018  PT End of Session - 03/23/18 1156    Visit Number  2    Number of Visits  2    PT Start Time  1100    PT Stop Time  1144    PT Time Calculation (min)  44 min    Behavior During Therapy  WFL for tasks assessed/performed       Past Medical History:  Diagnosis Date  . Anxiety   . Arthritis   . Depression   . Family history of breast cancer   . Family history of colon cancer   . Family history of leukemia   . Hypertension     Past Surgical History:  Procedure Laterality Date  . BACK SURGERY    . BREAST BIOPSY Left 03/10/2010   benign  . BREAST LUMPECTOMY WITH RADIOACTIVE SEED AND SENTINEL LYMPH NODE BIOPSY Left 02/21/2018   Procedure: BREAST LUMPECTOMY WITH RADIOACTIVE SEED AND SENTINEL LYMPH NODE BIOPSY;  Surgeon: Byerly, Faera, MD;  Location: [facility] OR;  Service: General;  Laterality: Left;  . CHOLECYSTECTOMY    . HYSTERECTOMY ABDOMINAL WITH SALPINGECTOMY    . NECK SURGERY    . TONSILLECTOMY      There were no vitals filed for this visit.  Subjective Assessment - 03/23/18 1109    Subjective  Patient underwent left lumpectomy and sentinel node biopsy on 02/21/18 (0/2 nodes positive). She began radiation today and will continue x20 sessions. She will undergo anti-estrogen therapy.    Pertinent History  Patient was diagnosed on 01/11/18 with left grade I invasive ductal carcinoma breast cancer and is located in the lower inner quadrant. It is ER/PR positive and HER2 negative with a Ki67 of 10%. Patient underwent left lumpectomy and sentinel node biopsy on 02/21/18 (0/2 nodes positive).    Patient Stated Goals  See if my arm is ok    Currently in Pain?   No/denies         [facility] PT Assessment - 03/23/18 0001      Assessment   Medical Diagnosis  Left breast cancer s/p lumpectomy and SLNB    Referring Provider  Dr. Faera Byerly    Onset Date/Surgical Date  02/21/18    Hand Dominance  Right    Prior Therapy  Baselines      Precautions   Precautions  Other (comment)    Precaution Comments  recent surgery      Restrictions   Weight Bearing Restrictions  No      Balance Screen   Has the patient fallen in the past 6 months  No    Has the patient had a decrease in activity level because of a fear of falling?   No    Is the patient reluctant to leave their home because of a fear of falling?   No      Home Environment   Living Environment  Private residence    Living Arrangements  Spouse/significant other    Available Help at Discharge  Family      Prior Function   Level of Independence  Independent    Vocation  Retired    Leisure  She is walking some      Cognition   Overall   Cognitive Status  Within Functional Limits for tasks assessed      Observation/Other Assessments   Observations  Incisions appear to be healing well with no signs of edema or infection.      AROM   AROM Assessment Site  Shoulder    Right/Left Shoulder  Left    Left Shoulder Extension  40 Degrees    Left Shoulder Flexion  139 Degrees    Left Shoulder ABduction  155 Degrees    Left Shoulder Internal Rotation  72 Degrees    Left Shoulder External Rotation  74 Degrees        LYMPHEDEMA/ONCOLOGY QUESTIONNAIRE - 03/23/18 1119      Type   Cancer Type  Left breast cancer      Surgeries   Lumpectomy Date  02/21/18    Sentinel Lymph Node Biopsy Date  02/21/18    Number Lymph Nodes Removed  2      Treatment   Active Chemotherapy Treatment  No    Past Chemotherapy Treatment  No    Active Radiation Treatment  Yes    Date  03/23/18    Body Site  left breast    Past Radiation Treatment  No    Current Hormone Treatment  No    Past Hormone Therapy  No       What other symptoms do you have   Are you Having Heaviness or Tightness  No    Are you having Pain  No    Are you having pitting edema  No    Is it Hard or Difficult finding clothes that fit  No    Do you have infections  No    Is there Decreased scar mobility  No    Stemmer Sign  No      Lymphedema Assessments   Lymphedema Assessments  Upper extremities      Right Upper Extremity Lymphedema   10 cm Proximal to Olecranon Process  26.8 cm    Olecranon Process  23.8 cm    10 cm Proximal to Ulnar Styloid Process  21.3 cm    Just Proximal to Ulnar Styloid Process  14.9 cm    Across Hand at Thumb Web Space  17.1 cm    At Base of 2nd Digit  6 cm      Left Upper Extremity Lymphedema   10 cm Proximal to Olecranon Process  26.7 cm    Olecranon Process  22.8 cm    10 cm Proximal to Ulnar Styloid Process  21.2 cm    Just Proximal to Ulnar Styloid Process  14.3 cm    Across Hand at Thumb Web Space  17 cm    At Base of 2nd Digit  5.9 cm        Quick Dash - 03/23/18 0001    Open a tight or new jar  Moderate difficulty    Do heavy household chores (wash walls, wash floors)  Mild difficulty    Carry a shopping bag or briefcase  No difficulty    Wash your back  Moderate difficulty    Use a knife to cut food  No difficulty    Recreational activities in which you take some force or impact through your arm, shoulder, or hand (golf, hammering, tennis)  Moderate difficulty    During the past week, to what extent has your arm, shoulder or hand problem interfered with your normal social activities with family, friends, neighbors, or groups?  Modererately      During the past week, to what extent has your arm, shoulder or hand problem limited your work or other regular daily activities  Modererately    Arm, shoulder, or hand pain.  Moderate    Tingling (pins and needles) in your arm, shoulder, or hand  None    Difficulty Sleeping  No difficulty    DASH Score  29.55 %                      PT Education - 03/23/18 1155    Education Details  Educated pt on importance of exercise for reducing recurrence risk and improving energy / decreasing cancer related fatigue    Person(s) Educated  Patient    Comprehension  Verbalized understanding          PT Long Term Goals - 03/23/18 1158      PT LONG TERM GOAL #1   Title  Patient will demonstrate she has returned to baseline post operatively related to shoulder ROM and function.    Time  8    Period  Weeks    Status  New            Plan - 03/23/18 1156    Clinical Impression Statement  Patient is doing very well s/p left lumpectomy and SLNB. She began radiation today and tolerated that without issues. She has no signs of lymphedema and has regained full shoulder function. Her only complaint is related to cancer related fatigue which may worsen with radiation. She was educated on free yoga classes, a walking porgram, and possibly joining the [employer] with [employer] program. She has no other neds for PT but will plan to attend the After Breast Cancer class on 07/25/17 to get further education on lymphedema risk reduction.    Rehab Potential  Excellent    PT Treatment/Interventions  ADLs/Self Care Home Management;Therapeutic exercise;Patient/family education    PT Next Visit Plan  D/C    PT Home Exercise Plan  Post op shoulder ROM HEP    Consulted and Agree with Plan of Care  Patient       Patient will benefit from skilled therapeutic intervention in order to improve the following deficits and impairments:  Decreased range of motion, Decreased knowledge of precautions, Impaired UE functional use, Postural dysfunction, Pain  Visit Diagnosis: Malignant neoplasm of lower-inner quadrant of left breast in female, estrogen receptor positive ([facility])  Abnormal posture  Aftercare following surgery for neoplasm     Problem List Patient Active Problem List   Diagnosis Date Noted  . Genetic  testing 02/14/2018  . Family history of breast cancer   . Family history of leukemia   . Family history of colon cancer   . Malignant neoplasm of lower-inner quadrant of left breast in female, estrogen receptor positive ([place]) 01/24/2018   PHYSICAL THERAPY DISCHARGE SUMMARY  Visits from Start of Care: 2  Current functional level related to goals / functional outcomes: Goals met; doing well   Remaining deficits: None   Education / Equipment: HEP and lymphedema risk reduction education Plan: Patient agrees to discharge.  Patient goals were met. Patient is being discharged due to meeting the stated rehab goals.  ?????         Annia Friendly, [place] 03/23/18 12:00 PM   [place], [place], [address] Phone: [phone]   Fax:  [phone]  Name: DORANNE SCHMUTZ MRN: [id number] Date of Birth: April 12, 1946

## 2018-03-24 ENCOUNTER — Telehealth: Payer: Self-pay | Admitting: Hematology

## 2018-03-24 NOTE — Telephone Encounter (Signed)
Appt added and pt notified per 9/17 sch msg

## 2018-03-27 ENCOUNTER — Ambulatory Visit
Admission: RE | Admit: 2018-03-27 | Discharge: 2018-03-27 | Disposition: A | Discharge status: Home / Self Care | Payer: Medicare Other | Source: Ambulatory Visit | Attending: Radiation Oncology | Admitting: Radiation Oncology

## 2018-03-27 DIAGNOSIS — C50312 Malignant neoplasm of lower-inner quadrant of left female breast: Secondary | ICD-10-CM | POA: Diagnosis not present

## 2018-03-28 ENCOUNTER — Ambulatory Visit
Admission: RE | Admit: 2018-03-28 | Discharge: 2018-03-28 | Disposition: A | Discharge status: Home / Self Care | Payer: Medicare Other | Source: Ambulatory Visit | Attending: Radiation Oncology | Admitting: Radiation Oncology

## 2018-03-28 DIAGNOSIS — Z170 Estrogen receptor positive status [ER+]: Principal | ICD-10-CM

## 2018-03-28 DIAGNOSIS — C50312 Malignant neoplasm of lower-inner quadrant of left female breast: Secondary | ICD-10-CM | POA: Diagnosis not present

## 2018-03-28 MED ORDER — ALRA NON-METALLIC DEODORANT (RAD-ONC)
1.0000 "application " | Freq: Once | TOPICAL | Status: AC
  Administered 2018-03-28: 1 via TOPICAL

## 2018-03-28 MED ORDER — RADIAPLEXRX EX GEL
Freq: Once | CUTANEOUS | Status: AC
  Administered 2018-03-28: 14:00:00 via TOPICAL

## 2018-03-29 ENCOUNTER — Ambulatory Visit
Admission: RE | Admit: 2018-03-29 | Discharge: 2018-03-29 | Disposition: A | Discharge status: Home / Self Care | Payer: Medicare Other | Source: Ambulatory Visit | Attending: Radiation Oncology | Admitting: Radiation Oncology

## 2018-03-29 DIAGNOSIS — C50312 Malignant neoplasm of lower-inner quadrant of left female breast: Secondary | ICD-10-CM | POA: Diagnosis not present

## 2018-03-30 ENCOUNTER — Ambulatory Visit
Admission: RE | Admit: 2018-03-30 | Discharge: 2018-03-30 | Disposition: A | Discharge status: Home / Self Care | Payer: Medicare Other | Source: Ambulatory Visit | Attending: Radiation Oncology | Admitting: Radiation Oncology

## 2018-03-30 DIAGNOSIS — C50312 Malignant neoplasm of lower-inner quadrant of left female breast: Secondary | ICD-10-CM | POA: Diagnosis not present

## 2018-03-31 ENCOUNTER — Ambulatory Visit
Admission: RE | Admit: 2018-03-31 | Discharge: 2018-03-31 | Disposition: A | Discharge status: Home / Self Care | Payer: Medicare Other | Source: Ambulatory Visit | Attending: Radiation Oncology | Admitting: Radiation Oncology

## 2018-03-31 DIAGNOSIS — C50312 Malignant neoplasm of lower-inner quadrant of left female breast: Secondary | ICD-10-CM | POA: Diagnosis not present

## 2018-04-03 ENCOUNTER — Ambulatory Visit
Admission: RE | Admit: 2018-04-03 | Discharge: 2018-04-03 | Disposition: A | Discharge status: Home / Self Care | Payer: Medicare Other | Source: Ambulatory Visit | Attending: Radiation Oncology | Admitting: Radiation Oncology

## 2018-04-03 DIAGNOSIS — C50312 Malignant neoplasm of lower-inner quadrant of left female breast: Secondary | ICD-10-CM | POA: Diagnosis not present

## 2018-04-04 ENCOUNTER — Ambulatory Visit
Admission: RE | Admit: 2018-04-04 | Discharge: 2018-04-04 | Disposition: A | Discharge status: Home / Self Care | Payer: Medicare Other | Source: Ambulatory Visit | Attending: Radiation Oncology | Admitting: Radiation Oncology

## 2018-04-04 DIAGNOSIS — Z170 Estrogen receptor positive status [ER+]: Secondary | ICD-10-CM | POA: Diagnosis not present

## 2018-04-04 DIAGNOSIS — Z510 Encounter for antineoplastic radiation therapy: Secondary | ICD-10-CM | POA: Insufficient documentation

## 2018-04-04 DIAGNOSIS — C50312 Malignant neoplasm of lower-inner quadrant of left female breast: Secondary | ICD-10-CM | POA: Insufficient documentation

## 2018-04-05 ENCOUNTER — Ambulatory Visit
Admission: RE | Admit: 2018-04-05 | Discharge: 2018-04-05 | Disposition: A | Discharge status: Home / Self Care | Payer: Medicare Other | Source: Ambulatory Visit | Attending: Radiation Oncology | Admitting: Radiation Oncology

## 2018-04-05 DIAGNOSIS — C50312 Malignant neoplasm of lower-inner quadrant of left female breast: Secondary | ICD-10-CM | POA: Diagnosis not present

## 2018-04-06 ENCOUNTER — Ambulatory Visit
Admission: RE | Admit: 2018-04-06 | Discharge: 2018-04-06 | Disposition: A | Discharge status: Home / Self Care | Payer: Medicare Other | Source: Ambulatory Visit | Attending: Radiation Oncology | Admitting: Radiation Oncology

## 2018-04-06 DIAGNOSIS — C50312 Malignant neoplasm of lower-inner quadrant of left female breast: Secondary | ICD-10-CM | POA: Diagnosis not present

## 2018-04-07 ENCOUNTER — Ambulatory Visit
Admission: RE | Admit: 2018-04-07 | Discharge: 2018-04-07 | Disposition: A | Discharge status: Home / Self Care | Payer: Medicare Other | Source: Ambulatory Visit | Attending: Radiation Oncology | Admitting: Radiation Oncology

## 2018-04-07 DIAGNOSIS — C50312 Malignant neoplasm of lower-inner quadrant of left female breast: Secondary | ICD-10-CM | POA: Diagnosis not present

## 2018-04-10 ENCOUNTER — Ambulatory Visit
Admission: RE | Admit: 2018-04-10 | Discharge: 2018-04-10 | Disposition: A | Discharge status: Home / Self Care | Payer: Medicare Other | Source: Ambulatory Visit | Attending: Radiation Oncology | Admitting: Radiation Oncology

## 2018-04-10 DIAGNOSIS — C50312 Malignant neoplasm of lower-inner quadrant of left female breast: Secondary | ICD-10-CM | POA: Diagnosis not present

## 2018-04-11 ENCOUNTER — Ambulatory Visit: Payer: Medicare Other | Admitting: Radiation Oncology

## 2018-04-11 ENCOUNTER — Ambulatory Visit
Admission: RE | Admit: 2018-04-11 | Discharge: 2018-04-11 | Disposition: A | Discharge status: Home / Self Care | Payer: Medicare Other | Source: Ambulatory Visit | Attending: Radiation Oncology | Admitting: Radiation Oncology

## 2018-04-11 DIAGNOSIS — C50312 Malignant neoplasm of lower-inner quadrant of left female breast: Secondary | ICD-10-CM | POA: Diagnosis not present

## 2018-04-12 ENCOUNTER — Ambulatory Visit
Admission: RE | Admit: 2018-04-12 | Discharge: 2018-04-12 | Disposition: A | Discharge status: Home / Self Care | Payer: Medicare Other | Source: Ambulatory Visit | Attending: Radiation Oncology | Admitting: Radiation Oncology

## 2018-04-12 DIAGNOSIS — C50312 Malignant neoplasm of lower-inner quadrant of left female breast: Secondary | ICD-10-CM | POA: Diagnosis not present

## 2018-04-13 ENCOUNTER — Ambulatory Visit
Admission: RE | Admit: 2018-04-13 | Discharge: 2018-04-13 | Disposition: A | Discharge status: Home / Self Care | Payer: Medicare Other | Source: Ambulatory Visit | Attending: Radiation Oncology | Admitting: Radiation Oncology

## 2018-04-13 DIAGNOSIS — C50312 Malignant neoplasm of lower-inner quadrant of left female breast: Secondary | ICD-10-CM | POA: Diagnosis not present

## 2018-04-14 ENCOUNTER — Ambulatory Visit
Admission: RE | Admit: 2018-04-14 | Discharge: 2018-04-14 | Disposition: A | Discharge status: Home / Self Care | Payer: Medicare Other | Source: Ambulatory Visit | Attending: Radiation Oncology | Admitting: Radiation Oncology

## 2018-04-14 DIAGNOSIS — C50312 Malignant neoplasm of lower-inner quadrant of left female breast: Secondary | ICD-10-CM | POA: Diagnosis not present

## 2018-04-17 ENCOUNTER — Ambulatory Visit: Payer: Medicare Other | Admitting: Hematology

## 2018-04-17 ENCOUNTER — Ambulatory Visit
Admission: RE | Admit: 2018-04-17 | Discharge: 2018-04-17 | Disposition: A | Discharge status: Home / Self Care | Payer: Medicare Other | Source: Ambulatory Visit | Attending: Radiation Oncology | Admitting: Radiation Oncology

## 2018-04-17 DIAGNOSIS — C50312 Malignant neoplasm of lower-inner quadrant of left female breast: Secondary | ICD-10-CM | POA: Diagnosis not present

## 2018-04-18 ENCOUNTER — Ambulatory Visit
Admission: RE | Admit: 2018-04-18 | Discharge: 2018-04-18 | Disposition: A | Discharge status: Home / Self Care | Payer: Medicare Other | Source: Ambulatory Visit | Attending: Radiation Oncology | Admitting: Radiation Oncology

## 2018-04-18 DIAGNOSIS — C50312 Malignant neoplasm of lower-inner quadrant of left female breast: Secondary | ICD-10-CM | POA: Diagnosis not present

## 2018-04-19 ENCOUNTER — Ambulatory Visit
Admission: RE | Admit: 2018-04-19 | Discharge: 2018-04-19 | Disposition: A | Discharge status: Home / Self Care | Payer: Medicare Other | Source: Ambulatory Visit | Attending: Radiation Oncology | Admitting: Radiation Oncology

## 2018-04-19 DIAGNOSIS — C50312 Malignant neoplasm of lower-inner quadrant of left female breast: Secondary | ICD-10-CM | POA: Diagnosis not present

## 2018-04-19 NOTE — Progress Notes (Signed)
[place]  [phone] Fax:([phone]) [place] Note   Patient Care Team: Burnard Bunting, MD as PCP - General (Internal Medicine) Gery Pray, MD as Consulting Physician (Radiation Oncology) Truitt Merle, MD as Consulting Physician (Hematology) Stark Klein, MD as Consulting Physician (General Surgery)   Date of Service:  04/20/2018  CHIEF COMPLAINTS:  Follow up left breast cancer     Oncology History   Cancer Staging Malignant neoplasm of lower-inner quadrant of left breast in female, estrogen receptor positive ([place]) Staging form: Breast, AJCC 8th Edition - Clinical stage from 01/16/2018: Stage IA (cT1b, cN0, cM0, G1, ER+, PR+, HER2-) - Signed by Truitt Merle, MD on 01/25/2018 - Pathologic stage from 02/21/2018: Stage IA (pT1c, pN0, cM0, G1, ER+, PR+, HER2-, Oncotype DX score: 18) - Signed by Truitt Merle, MD on 04/20/2018       Malignant neoplasm of lower-inner quadrant of left breast in female, estrogen receptor positive ([place])   01/16/2018 Mammogram    Diagnostic Mammogram of left breast 01/16/18 IMPRESSION: 1.  There is a suspicious mass measuring 7x6x7 mm in the left breast at 8 o'clock position 1 cm from the nipple. 2.  No evidence of left axillary lymphadenopathy. RECOMMENDATION: Ultrasound guided biopsy is recommended for the left breast mass.     01/16/2018 Initial Biopsy    Diagnosis 01/16/18 Breast, left, needle core biopsy, 8 o'clock - INVASIVE DUCTAL CARCINOMA. - DUCTAL CARCINOMA IN SITU.     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 T AIP,ALK, APC, ATM, AXIN2,BAP1,  BARD1, BLM, BMPR1A, BRCA1, BRCA2, BRIP1, BUB1B, CASR, CDC73, CDH1, CDK4, CDKN1B, CDKN1C, CDKN2A (p14ARF), CDKN2A (p16INK4a), CEBPA, CEP57, CHEK2, CTNNA1, DICER1, DIS3L2, EGFR (c.2369C>T, p.Thr790Met variant only), ENG, EPCAM (Deletion/duplication testing only), FH, FLCN, GATA2, GALNT12, GPC3, GREM1 (Promoter region deletion/duplication testing only), HOXB13 (c.251G>A, p.Gly84Glu), HRAS, KIT, MAX, MEN1, MET, MITF (c.952G>A, p.Glu318Lys variant only), MLH1, MSH2, MSH3, MSH6, MUTYH, NBN, NF1, NF2, NTHL1, PALB2, PDGFRA, PHOX2B, PMS2, POLD1, POLE, POT1, PRKAR1A, PTCH1, PTEN, RAD50, RAD51C, RAD51D, RB1, RECQL4, RET, RUNX1, RSP20, RNF43, SDHAF2, SDHA (sequence changes only),SDHAF2, SDHB, SDHC, SDHD, SMAD4, SMARCA4, SMARCB1, SMARCE1, STK11, SUFU, TERC, TERT, TMEM127, TP53, TSC1, TSC2, VHL, WRN and WT1.   Results: No pathogenic variants identified.  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  The date of this test report is 02/09/2018.      02/21/2018 Surgery    She had right breast lumpectomy with Dr. Barry Dienes on 02/21/2018.    02/21/2018 Pathology Results    02/21/2018 Surgical Pathology Diagnosis 1. Breast, lumpectomy, Left - INVASIVE AND IN SITU DUCTAL CARCINOMA, 1.2 CM, GRADE I. - MARGINS NOT INVOLVED. - PREVIOUS BIOPSY SITE AND BIOPSY CLIP. - FIBROCYSTIC CHANGES WITH USUAL DUCTAL HYPERPLASIA. 2. Lymph node, sentinel, biopsy, Left Axillary #1 - ONE BENIGN  LYMPH NODE (0/1). 3. Lymph node, sentinel, biopsy, Left Axillary #2 - ONE BENIGN LYMPH NODE (0/1).    02/21/2018 Oncotype testing     Oncotype:  Recurrence score 18  Distant recurrence risk at 9 years with Tamoxifen or AI alone is 5% Less than 1% benefit from adjuvant chemotherapy     02/21/2018 Cancer Staging    Staging form: Breast, AJCC 8th Edition - Pathologic stage from 02/21/2018: Stage IA (pT1c, pN0, cM0, G1, ER+, PR+, HER2-, Oncotype DX score: 18) - Signed by Truitt Merle, MD on 04/20/2018    03/27/2018 -  Radiation Therapy    she started daily radiation therapy with Dr. Sondra Come on 03/27/2018-04/21/18      HISTORY OF PRESENTING ILLNESS: [name] 72 y.o. female is a here because of newly diagnosed breast cancer. The patient presents to the Breast Clinic today accompanied by her husband.  Her left breast mass was found by screening mammogram. She has one every year and this is her first abnormal mammogram. She notes she feels no different or noticed any changes. She is overall at baseline.   Socially she is married and retired. She had 3 kids, 1 of which died from leukemia at 45yo. She is left with 2 sons.   She has a PMHx of several back and neck surgeries. She has arthritis and HTN and depression which she is on medication for. She tries to be very active.She notes multiple family members had breast cancer. She drinks occasionally and is a non-smoker.    GYN HISTORY  Menarchal: 72yo LMP: 72yo due to complete hysterectomy  Contraceptive: yes, for 2 years in the past HRT: Took for 10 years and stopped in Sep 22, 1994 G3P3: 2 sons, 1 child died young  CURRENT THERAPY Daily Radiation started on 03/27/2018 and completes on 04/21/18, pending adjuvant antiestrogen therapy  INTERVAL HISTORY [name] is a 72 y.o. female who is here for follow-up. She had right breast lumpectomy with Dr. Barry Dienes on 02/21/2018. She started daily radiation therapy with Dr. Sondra Come on 03/27/2018 and plans to complete tomorrow.  Today, she is here by herself. She notes she has tolerated her radiation well over all with some mild pain and  skin erythema.  She notes after her hysterectomy she was on hormone replacements for 10 years. When she stopped replacements she only had hot flashes that were manageable. She has back pain at times for which she takes tylenol or Advil for. She used to get bone density scan but stopped after 2009-09-22 due to back surgery.  She notes she is on Abilify for her depression. This is managed by her Psychiatrist who she sees regularly.      MEDICAL HISTORY:  Past Medical History:  Diagnosis Date  . Anxiety   . Arthritis   . Depression   . Family history of breast cancer   . Family history of colon cancer   . Family history of leukemia   . Hypertension     SURGICAL HISTORY: Past Surgical History:  Procedure Laterality Date  . BACK SURGERY    . BREAST BIOPSY Left 03/10/2010   benign  . BREAST LUMPECTOMY WITH RADIOACTIVE SEED AND SENTINEL LYMPH NODE BIOPSY Left 02/21/2018   Procedure: BREAST LUMPECTOMY WITH RADIOACTIVE SEED AND SENTINEL LYMPH NODE BIOPSY;  Surgeon: Stark Klein, MD;  Location: [facility];  Service: General;  Laterality: Left;  . CHOLECYSTECTOMY    . HYSTERECTOMY ABDOMINAL WITH SALPINGECTOMY    . NECK SURGERY    . TONSILLECTOMY  SOCIAL HISTORY: Social History   Socioeconomic History  . Marital status: Married    Spouse name: Not on file  . Number of children: Not on file  . Years of education: Not on file  . Highest education level: Not on file  Occupational History  . Not on file  Social Needs  . Financial resource strain: Not on file  . Food insecurity:    Worry: Not on file    Inability: Not on file  . Transportation needs:    Medical: Not on file    Non-medical: Not on file  Tobacco Use  . Smoking status: Never Smoker  . Smokeless tobacco: Never Used  Substance and Sexual Activity  . Alcohol use: Yes    Comment: social drinker  . Drug use: No  . Sexual activity: Not on file  Lifestyle  . Physical activity:    Days per week: Not on file    Minutes per  session: Not on file  . Stress: Not on file  Relationships  . Social connections:    Talks on phone: Not on file    Gets together: Not on file    Attends religious service: Not on file    Active member of club or organization: Not on file    Attends meetings of clubs or organizations: Not on file    Relationship status: Not on file  . Intimate partner violence:    Fear of current or ex partner: Not on file    Emotionally abused: Not on file    Physically abused: Not on file    Forced sexual activity: Not on file  Other Topics Concern  . Not on file  Social History Narrative  . Not on file    FAMILY HISTORY: Family History  Problem Relation Age of Onset  . Breast cancer Mother 15       deceased @ 53  . Breast cancer Maternal Aunt        dx 27?, died in 42's  . Breast cancer Maternal Aunt        dx 48?, died in 15's  . Breast cancer Maternal Aunt        dx 69?, died in 21's  . Leukemia Son 12       deceased @ 58  . Stomach cancer Maternal Grandmother 19  . Leukemia Maternal Uncle 19  . Pancreatic cancer Paternal Uncle 53  . Colon cancer Brother 63  . Cancer - Other Brother        pituitary cancer 60's  . Cancer Brother        neck/thyroid/throat cancer? 60's/70's  . Skin cancer Brother   . Leukemia Father        28's  . Colon cancer Cousin 83    ALLERGIES:  has No Known Allergies.  MEDICATIONS:  Current Outpatient Medications  Medication Sig Dispense Refill  . ARIPiprazole (ABILIFY) 5 MG tablet Take 5 mg by mouth daily. In the morning.    . Ascorbic Acid (VITAMIN C) 1000 MG tablet Take 1,000 mg by mouth daily.    . carvedilol (COREG) 12.5 MG tablet Take 12.5 mg by mouth daily.     . DULoxetine (CYMBALTA) 60 MG capsule Take 60 mg by mouth daily.    Marland Kitchen ibuprofen (ADVIL,MOTRIN) 200 MG tablet Take 400 mg by mouth every 8 (eight) hours as needed for mild pain (for pain .).     Marland Kitchen loratadine (CLARITIN) 10 MG tablet Take 10 mg by mouth daily.    Marland Kitchen  LORazepam (ATIVAN) 1  MG tablet Take 1 mg by mouth every 8 (eight) hours as needed for anxiety.     . meloxicam (MOBIC) 7.5 MG tablet Take 15 mg by mouth daily.  4  . olmesartan-hydrochlorothiazide (BENICAR HCT) 40-25 MG tablet Take 1 tablet by mouth at bedtime.     Marland Kitchen omeprazole (PRILOSEC) 40 MG capsule Take 40 mg by mouth daily.    Marland Kitchen oxyCODONE (OXY IR/ROXICODONE) 5 MG immediate release tablet Take 0.5-1 tablets (2.5-5 mg total) by mouth every 4 (four) hours as needed for severe pain. (Patient not taking: Reported on 03/15/2018) 10 tablet 0  . Probiotic Product (PROBIOTIC PO) Take 1 capsule by mouth daily.    . traZODone (DESYREL) 50 MG tablet Take 50 mg by mouth at bedtime.     No current facility-administered medications for this visit.     REVIEW OF SYSTEMS:   Constitutional: Denies fevers, chills or abnormal night sweats Eyes: Denies blurriness of vision, double vision or watery eyes Ears, nose, mouth, throat, and face: Denies mucositis or sore throat Respiratory: Denies cough, dyspnea or wheezes Cardiovascular: Denies palpitation, chest discomfort or lower extremity swelling Gastrointestinal:  Denies nausea, heartburn or change in bowel habits Skin: Denies abnormal skin rashes Lymphatics: Denies new lymphadenopathy or easy bruising Neurological:Denies numbness, tingling or new weaknesses Behavioral/Psych: Mood is stable, no new changes  All other systems were reviewed with the patient and are negative.  PHYSICAL EXAMINATION: ECOG PERFORMANCE STATUS: 0 - Asymptomatic  Vitals:   04/20/18 1025  BP: (!) 145/92  Pulse: 65  Resp: 16  Temp: 97.8 F (36.6 C)  SpO2: 96%   Filed Weights   04/20/18 1025  Weight: 158 lb 1.6 oz (71.7 kg)    GENERAL:alert, no distress and comfortable SKIN: skin color, texture, turgor are normal, no rashes or significant lesions EYES: normal, conjunctiva are pink and non-injected, sclera clear OROPHARYNX:no exudate, no erythema and lips, buccal mucosa, and tongue normal    NECK: supple, thyroid normal size, non-tender, without nodularity LYMPH:  no palpable lymphadenopathy in the cervical, axillary or inguinal LUNGS: clear to auscultation and percussion with normal breathing effort HEART: regular rate & rhythm and no murmurs and no lower extremity edema ABDOMEN:abdomen soft, non-tender and normal bowel sounds Musculoskeletal:no cyanosis of digits and no clubbing  PSYCH: alert & oriented x 3 with fluent speech NEURO: no focal motor/sensory deficits BREAST: Breasts: Breast inspection showed them to be symmetrical with no nipple discharge. (+) Mild to moderate skin erythema from radiation (+) S/p left lumpectomy: Surgical incision healing well.   LABORATORY DATA:  I have reviewed the data as listed CBC Latest Ref Rng & Units 02/21/2018 02/15/2018 01/25/2018  WBC 4.0 - 10.5 K/uL - 8.8 8.6  Hemoglobin 12.0 - 15.0 g/dL 13.3 14.1 13.5  Hematocrit 36.0 - 46.0 % 39.0 42.2 39.0  Platelets 150 - 400 K/uL - 257 264    CMP Latest Ref Rng & Units 02/21/2018 02/15/2018 01/25/2018  Glucose 70 - 99 mg/dL 85 101(H) 75  BUN 8 - 23 mg/dL - 10 13  Creatinine 0.44 - 1.00 mg/dL - 0.86 0.86  Sodium 135 - 145 mmol/L 129(L) 133(L) 136  Potassium 3.5 - 5.1 mmol/L 3.3(L) 2.7(LL) 3.2(L)  Chloride 98 - 111 mmol/L - 94(L) 95(L)  CO2 22 - 32 mmol/L - 27 30  Calcium 8.9 - 10.3 mg/dL - 9.5 9.1  Total Protein 6.5 - 8.1 g/dL - - 6.6  Total Bilirubin 0.3 - 1.2 mg/dL - - 0.3  Alkaline Phos 38 - 126 U/L - - 84  AST 15 - 41 U/L - - 16  ALT 0 - 44 U/L - - 10     PATHOLOGY   Diagnosis 02/21/18 1. Breast, lumpectomy, Left - INVASIVE AND IN SITU DUCTAL CARCINOMA, 1.2 CM, GRADE I. - MARGINS NOT INVOLVED. - PREVIOUS BIOPSY SITE AND BIOPSY CLIP. - FIBROCYSTIC CHANGES WITH USUAL DUCTAL HYPERPLASIA. 2. Lymph node, sentinel, biopsy, Left Axillary #1 - ONE BENIGN LYMPH NODE (0/1). 3. Lymph node, sentinel, biopsy, Left Axillary #2 - ONE BENIGN LYMPH NODE (0/1). Microscopic Comment 1.  INVASIVE CARCINOMA OF THE BREAST: Resection Procedure: Localization lumpectomy and two sentinel lymph nodes Specimen Laterality: Left breast Tumor Size: 1.2 cm Histologic Type: Ductal Histologic Grade: I Glandular (Acinar)/Tubular Differentiation 2 Nuclear Pleomorphism: 2 Mitotic Rate: 1 Overall Grade: 5 Ductal Carcinoma In Situ: Present Tumor Extension: N/A Margins: Free of tumor Distance from closest margin (millimeters): 0.3 cm from anterior lateral junction Specify closest margin (required only if <70m): Anterior lateral junction DCIS Margins Cannot be determined Regional Lymph Nodes: Number of Lymph Nodes Examined: 2 Number of Sentinel Nodes Examined (if applicable): 2 Number of Lymph Nodes with Macrometastases (>2 mm): 0 1 of 3 FINAL for CDARNELLA, [phone] Microscopic Comment(continued) Number of Lymph Nodes with Micrometastases: 0 Number of Lymph Nodes with Isolated Tumor Cells (?0.2 mm or ?200 cells)#: 0 Size of Largest Metastatic Deposit (millimeters): N/A Extranodal Extension N/A Treatment Effect: No known presurgical therapy Breast Biomarker Testing Performed on Previous Biopsy: Yes Testing Performed on Case Number: [id number] Receptor: 100%, positive, strong staining Progesterone Receptor: 90%, positive, strong staining HER2: Negative, ratio is 1.21 ki-67: 10% Representative tumor block: 1A, 1B Pathologic Stage Classification (pTNM, AJCC 8th Edition): pT1c, pN0 (JDP:kh 02-22-18) (v4.2.0.0)    Diagnosis 01/16/18 Breast, left, needle core biopsy, 8 o'clock - INVASIVE DUCTAL CARCINOMA. - DUCTAL CARCINOMA IN SITU. - SEE COMMENT. Microscopic Comment The carcinoma appears grade I. A breast prognostic profile will be performed and the results reported separately. The results are called to [facility] 01/17/18. (JBK:gt, 01/17/18) PROGNOSTIC INDICATORS Results: IMMUNOHISTOCHEMICAL AND MORPHOMETRIC ANALYSIS PERFORMED  MANUALLY Estrogen Receptor: 100%, POSITIVE, STRONG STAINING INTENSITY Progesterone Receptor: 90%, POSITIVE, STRONG STAINING INTENSITY Proliferation Marker Ki67: 10% REFERENCE RANGE ESTROGEN RECEPTOR NEGATIVE 0% POSITIVE =>1% REFERENCE RANGE PROGESTERONE RECEPTOR NEGATIVE 0% POSITIVE =>1% All controls stained appropriately JEnid CutterMD Pathologist, Electronic Signature ( Signed 01/19/2018) FLUORESCENCE IN-SITU HYBRIDIZATION Results: HER2 - NEGATIVE RATIO OF HER2/CEP17 SIGNALS 1.21 AVERAGE HER2 COPY NUMBER PER CELL 2.35    02/21/2018 Molecular Pathology     01/30/2018 Molecular Pathology     RADIOGRAPHIC STUDIES: I have personally reviewed the radiological images as listed and agreed with the findings in the report. No results found.  ASSESSMENT & PLAN:  SSHARONANN MALBROUGHis a 72y.o. Caucasian female with a history of arthritis and HTN.    1. 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 She has low recurrence score 18, which predicts 90-year risk of distant recurrence 5% with antiestrogen therapy, no benefit of adjuvant therapy. -She started radiation with Dr. KSondra Comeon 03/27/18 and plans to complete on 04/21/18. She is tolerating very well.  -Giving the strong ER and PR expression in her postmenopausal status, I recommend adjuvant endocrine therapy with aromatase  inhibitor or tamoxifen for a total of 5-10 years to reduce the risk of cancer recurrence. Potential benefits and side effects were discussed with patient in detail and she is interested.  The potential benefit and side effects were discussed with her in details, which includes but not limited to, hot flash, weight gain, mood swing, increased risk of cardiovascular disease, risk of thrombosis from tamoxifen,  osteoporosis etc. she previously had a hysterectomy, no risk of endometrial cancer from tamoxifen.  Her last bone density scan several years ago showed osteoporosis of the left arm, a repeat bone density scan in the next 2 to 3 weeks.  -If her bone density scan shows significant osteoporosis, I recommend tamoxifen, otherwise anastrozole. She agree with the plan  -I discussed Tamoxifen may interact with her Abilify. Will discuss this further based on what drug she is prescribed.  -We also discussed the breast cancer surveillance after her surgery. She will continue annual screening mammogram, self exam, and a routine office visit with lab and exam with [place]. -I offered her a chance to go to survivorship clinic for post treatment support. She is interested. Will schedule her for 6 months.  -F/u in 3 months    2. Bone Health  -She was seen to have osteopenia in 01/2010 DEXA, lowest T-Score -2.8 at the left arm -She had not had one since her back surgery due to limited view.  -I will get DEXA of her hips to see if her density has worsened. Will prescribe her Anastrozole or Tamoxifen based on results.    3. Genetic Testing -Given her strong family history of breast cancer she is eligible for genetic testing. She is interested.  -Genetic testing performed through [facility] Multi-Cancer Panel + Colorectal preliminary evidence panel + Myelodysplastic/Leukemia Panel reported out on 02/09/2018 showed no pathogenic mutations. 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     PLAN: -DEXA scan at [facility] in 2 weeks, she will call me after the scan.  If she has significant osteoporosis, I recommended tamoxifen.  Otherwise I will call in anastrozole. -Lab and f/u in 3 months  -Survivorship in 6 months    All questions were answered. The patient knows to call the clinic with any problems, questions or concerns. I spent 25 minutes counseling the patient face to face.  The total time spent in the appointment was 30 minutes and more than 50% was on counseling.  Oneal Deputy, am acting as scribe for Truitt Merle, MD.    I have reviewed the above documentation for accuracy and completeness, and I agree with the above.      Truitt Merle, MD 04/20/2018

## 2018-04-20 ENCOUNTER — Ambulatory Visit
Admission: RE | Admit: 2018-04-20 | Discharge: 2018-04-20 | Disposition: A | Discharge status: Home / Self Care | Payer: Medicare Other | Source: Ambulatory Visit | Attending: Radiation Oncology | Admitting: Radiation Oncology

## 2018-04-20 ENCOUNTER — Inpatient Hospital Stay: Payer: Medicare Other | Attending: Hematology | Admitting: Hematology

## 2018-04-20 ENCOUNTER — Encounter: Payer: Self-pay | Admitting: Hematology

## 2018-04-20 VITALS — BP 145/92 | HR 65 | Temp 97.8°F | Resp 16 | Ht 67.0 in | Wt 158.1 lb

## 2018-04-20 DIAGNOSIS — M810 Age-related osteoporosis without current pathological fracture: Secondary | ICD-10-CM | POA: Diagnosis not present

## 2018-04-20 DIAGNOSIS — Z806 Family history of leukemia: Secondary | ICD-10-CM | POA: Diagnosis not present

## 2018-04-20 DIAGNOSIS — I10 Essential (primary) hypertension: Secondary | ICD-10-CM | POA: Diagnosis not present

## 2018-04-20 DIAGNOSIS — E2839 Other primary ovarian failure: Secondary | ICD-10-CM

## 2018-04-20 DIAGNOSIS — Z90710 Acquired absence of both cervix and uterus: Secondary | ICD-10-CM | POA: Diagnosis not present

## 2018-04-20 DIAGNOSIS — C50312 Malignant neoplasm of lower-inner quadrant of left female breast: Secondary | ICD-10-CM

## 2018-04-20 DIAGNOSIS — Z808 Family history of malignant neoplasm of other organs or systems: Secondary | ICD-10-CM

## 2018-04-20 DIAGNOSIS — Z800 Family history of malignant neoplasm of digestive organs: Secondary | ICD-10-CM

## 2018-04-20 DIAGNOSIS — Z923 Personal history of irradiation: Secondary | ICD-10-CM | POA: Diagnosis not present

## 2018-04-20 DIAGNOSIS — F329 Major depressive disorder, single episode, unspecified: Secondary | ICD-10-CM | POA: Diagnosis not present

## 2018-04-20 DIAGNOSIS — Z170 Estrogen receptor positive status [ER+]: Secondary | ICD-10-CM | POA: Diagnosis not present

## 2018-04-20 DIAGNOSIS — Z79899 Other long term (current) drug therapy: Secondary | ICD-10-CM | POA: Diagnosis not present

## 2018-04-20 DIAGNOSIS — Z803 Family history of malignant neoplasm of breast: Secondary | ICD-10-CM

## 2018-04-20 DIAGNOSIS — Z9221 Personal history of antineoplastic chemotherapy: Secondary | ICD-10-CM | POA: Diagnosis not present

## 2018-04-21 ENCOUNTER — Ambulatory Visit
Admission: RE | Admit: 2018-04-21 | Discharge: 2018-04-21 | Disposition: A | Discharge status: Home / Self Care | Payer: Medicare Other | Source: Ambulatory Visit | Attending: Radiation Oncology | Admitting: Radiation Oncology

## 2018-04-21 DIAGNOSIS — C50312 Malignant neoplasm of lower-inner quadrant of left female breast: Secondary | ICD-10-CM | POA: Diagnosis not present

## 2018-04-24 ENCOUNTER — Encounter: Payer: Self-pay | Admitting: Radiation Oncology

## 2018-04-24 NOTE — Progress Notes (Signed)
  Radiation Oncology         [phone] ________________________________  Name: [name] MRN: [id number]  Date: 04/24/2018  DOB: [birth year]  End of Treatment Note  Diagnosis:   ClinicalStageT1b,Nx,mx leftBreast LIQ Invasive Ductal Carcinoma, ER+/ PR+/ Her2-, Grade1  Pathologic stage: pT1c, pN0     Indication for treatment:  Curative       Radiation treatment dates:   03/27/2018-04/21/2018  Site/dose:   1. Left Breast, 2.67 Gy in 15 fractions for a total dose of 40.05 Gy           2. Boost, 2 Gy in 5 fractions, for a total dose of 10 Gy  Beams/energy:   1. 3D, 6X         2. 3D, 10X//6X  Narrative: The patient tolerated radiation treatment relatively well. At the beginning of treatment, pt denied pain, fatigue, skin issues. Pt was given and instructed on the use of Radiaplex. Towards the end of treatment, pt reported mild fatigue, slightly hyperpigmented skin, itching/burning to left breast. She is using radiaplex as prescribed. She denied pain throughout her treatments.    Plan: The patient has completed radiation treatment. The patient will return to radiation oncology clinic for routine followup in one month. I advised them to call or return sooner if they have any questions or concerns related to their recovery or treatment.  -----------------------------------  Blair Promise, PhD, MD  This document serves as a record of services personally performed by Gery Pray, MD. It was created on his behalf by [facility], a trained medical scribe. The creation of this record is based on the scribe's personal observations and the provider's statements to them. This document has been checked and approved by the attending provider.

## 2018-04-26 ENCOUNTER — Encounter: Payer: Self-pay | Admitting: *Deleted

## 2018-04-26 ENCOUNTER — Telehealth: Payer: Self-pay | Admitting: Hematology

## 2018-04-26 NOTE — Telephone Encounter (Signed)
Scheduled appt per 10/23 sch message - pt is aware of appts added.

## 2018-05-25 ENCOUNTER — Ambulatory Visit: Admission: RE | Admit: 2018-05-25 | Payer: Medicare Other | Source: Ambulatory Visit | Admitting: Radiation Oncology

## 2018-07-12 ENCOUNTER — Ambulatory Visit
Admission: RE | Admit: 2018-07-12 | Discharge: 2018-07-12 | Disposition: A | Discharge status: Home / Self Care | Payer: Medicare Other | Source: Ambulatory Visit | Attending: Hematology | Admitting: Hematology

## 2018-07-12 DIAGNOSIS — E2839 Other primary ovarian failure: Secondary | ICD-10-CM

## 2018-07-16 ENCOUNTER — Other Ambulatory Visit: Payer: Self-pay | Admitting: Hematology

## 2018-07-16 MED ORDER — ANASTROZOLE 1 MG PO TABS: 1.0000 mg | ORAL_TABLET | Freq: Every day | ORAL | 3 refills | Status: DC

## 2018-07-18 ENCOUNTER — Telehealth: Payer: Self-pay

## 2018-07-18 NOTE — Telephone Encounter (Signed)
-----   Message from Truitt Merle, MD sent at 07/16/2018  8:34 PM EST ----- Please let pt know her DEXA result, she has osteopenia, no high risk for fracture, please take calcium and VitD OTC. I have called in anastrozole for her, please start this week, thanks   Truitt Merle  07/16/2018

## 2018-07-18 NOTE — Telephone Encounter (Signed)
Spoke with patient's husband, she was unavailable, per Dr. Burr Medico her bone density scan results show osteopenia, no high risk for fracture.  Instructed to take calcium and Vitamin D supplement.  Also she has sent in anastrozole for her to start taking this week.  He verbalized an understanding and states he will pass on this information.

## 2018-07-21 NOTE — Progress Notes (Signed)
[place]   [phone] [phone]   Clinic Follow up Note   Patient Care Team: Burnard Bunting, MD as PCP - General (Internal Medicine) Gery Pray, MD as Consulting Physician (Radiation Oncology) Truitt Merle, MD as Consulting Physician (Hematology) Stark Klein, MD as Consulting Physician (General Surgery) 07/27/2018  CHIEF COMPLAINT: F/u on left breast cancer   SUMMARY OF ONCOLOGIC HISTORY: Oncology History   Cancer Staging Malignant neoplasm of lower-inner quadrant of left breast in female, estrogen receptor positive ([place]) Staging form: Breast, AJCC 8th Edition - Clinical stage from 01/16/2018: Stage IA (cT1b, cN0, cM0, G1, ER+, PR+, HER2-) - Signed by Truitt Merle, MD on 01/25/2018 - Pathologic stage from 02/21/2018: Stage IA (pT1c, pN0, cM0, G1, ER+, PR+, HER2-, Oncotype DX score: 18) - Signed by Truitt Merle, MD on 04/20/2018       Malignant neoplasm of lower-inner quadrant of left breast in female, estrogen receptor positive ([place])   01/16/2018 Mammogram    Diagnostic Mammogram of left breast 01/16/18 IMPRESSION: 1.  There is a suspicious mass measuring 7x6x7 mm in the left breast at 8 o'clock position 1 cm from the nipple. 2.  No evidence of left axillary lymphadenopathy. RECOMMENDATION: Ultrasound guided biopsy is recommended for the left breast mass.     01/16/2018 Initial Biopsy    Diagnosis 01/16/18 Breast, left, needle core biopsy, 8 o'clock - INVASIVE DUCTAL CARCINOMA. - DUCTAL CARCINOMA IN SITU.     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 T AIP,ALK, APC, ATM, AXIN2,BAP1,  BARD1, BLM, BMPR1A, BRCA1, BRCA2, BRIP1, BUB1B, CASR, CDC73, CDH1, CDK4, CDKN1B, CDKN1C, CDKN2A (p14ARF), CDKN2A (p16INK4a), CEBPA, CEP57, CHEK2, CTNNA1, DICER1, DIS3L2, EGFR (c.2369C>T, p.Thr790Met variant only), ENG, EPCAM (Deletion/duplication testing only), FH, FLCN, GATA2, GALNT12, GPC3, GREM1 (Promoter region deletion/duplication testing only), HOXB13 (c.251G>A, p.Gly84Glu), HRAS, KIT, MAX, MEN1, MET, MITF (c.952G>A, p.Glu318Lys variant only), MLH1, MSH2, MSH3, MSH6, MUTYH, NBN, NF1, NF2, NTHL1, PALB2, PDGFRA, PHOX2B, PMS2, POLD1, POLE, POT1, PRKAR1A, PTCH1, PTEN, RAD50, RAD51C, RAD51D, RB1, RECQL4, RET, RUNX1, RSP20, RNF43, SDHAF2, SDHA (sequence changes only),SDHAF2, SDHB, SDHC, SDHD, SMAD4, SMARCA4, SMARCB1, SMARCE1, STK11, SUFU, TERC, TERT, TMEM127, TP53, TSC1, TSC2, VHL, WRN and WT1.   Results: No pathogenic variants identified.  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  The date of this test report is 02/09/2018.      02/21/2018 Surgery    She had right breast lumpectomy with Dr. Barry Dienes on 02/21/2018.    02/21/2018 Pathology Results    02/21/2018 Surgical Pathology Diagnosis 1. Breast, lumpectomy, Left - INVASIVE AND IN SITU DUCTAL CARCINOMA, 1.2 CM, GRADE I. - MARGINS NOT INVOLVED. - PREVIOUS BIOPSY SITE AND BIOPSY CLIP. - FIBROCYSTIC CHANGES WITH USUAL DUCTAL HYPERPLASIA. 2. Lymph node, sentinel, biopsy, Left Axillary #1 - ONE BENIGN LYMPH NODE (0/1).  3. Lymph node, sentinel, biopsy, Left Axillary #2 - ONE BENIGN LYMPH NODE (0/1).    02/21/2018 Oncotype testing     Oncotype:  Recurrence score 18  Distant recurrence risk at 9 years with Tamoxifen or AI alone is 5% Less than 1% benefit from adjuvant chemotherapy     02/21/2018 Cancer Staging    Staging form: Breast, AJCC 8th Edition - Pathologic stage from 02/21/2018: Stage IA (pT1c, pN0, cM0, G1, ER+, PR+, HER2-, Oncotype DX score: 18) - Signed by Truitt Merle, MD on 04/20/2018    02/21/2018 Mammogram    02/21/2018 Mammogram FINDINGS: Status post excision of the left breast. The wire tip and biopsy marker clip are present and are marked for pathology. The locations of the radioactive seed and biopsy marker clip within the specimen were discussed with the OR staff during the procedure.  IMPRESSION: Specimen radiograph of the left breast.    03/27/2018 - 04/21/2018 Radiation Therapy    she started daily radiation therapy with Dr. Sondra Come on 03/27/2018-04/21/18     07/12/2018 Imaging    07/12/2018 DEXA ASSESSMENT: The BMD measured at Forearm Radius 33% is 0.745 g/cm2 with a T-score of -1.6. This patient is considered osteopenic according to [facility]) criteria.     CURRENT THERAPY Adjuvant anastrozole, started 07/2018    INTERVAL HISTORY: [name] is a 73 y.o. female who is here for follow-up. She completed radiation on 04/21/2018. Today, she is here by herself.  She started anastrozole 1/2 weeks ago, has been tolerating very well.  She has no hot flash, or noticeable joint discomfort.  She has good appetite and energy level.  She has been busy taking care of her 50-monthold grandchild, and her husband who has multiple medical issues.   Pertinent positives and negatives of review of systems are listed and detailed within the above HPI.  REVIEW OF SYSTEMS:   Constitutional: Denies fevers, chills or abnormal weight loss Eyes: Denies blurriness of vision Ears, nose, mouth, throat, and face: Denies mucositis or sore throat Respiratory: Denies cough, dyspnea or  wheezes Cardiovascular: Denies palpitation, chest discomfort or lower extremity swelling Gastrointestinal:  Denies nausea, heartburn or change in bowel habits Skin: Denies abnormal skin rashes Lymphatics: Denies new lymphadenopathy or easy bruising Neurological:Denies numbness, tingling or new weaknesses Behavioral/Psych: Mood is stable, no new changes  All other systems were reviewed with the patient and are negative.  MEDICAL HISTORY:  Past Medical History:  Diagnosis Date  . Anxiety   . Arthritis   . Depression   . Family history of breast cancer   . Family history of colon cancer   . Family history of leukemia   . Hypertension     SURGICAL HISTORY: Past Surgical History:  Procedure Laterality Date  . BACK SURGERY    . BREAST BIOPSY Left 03/10/2010   benign  . BREAST LUMPECTOMY WITH RADIOACTIVE SEED AND SENTINEL LYMPH NODE BIOPSY Left 02/21/2018   Procedure: BREAST LUMPECTOMY WITH RADIOACTIVE SEED AND SENTINEL LYMPH NODE BIOPSY;  Surgeon: BStark Klein MD;  Location: [facility]  Service: General;  Laterality: Left;  . CHOLECYSTECTOMY    . HYSTERECTOMY ABDOMINAL WITH SALPINGECTOMY    . NECK SURGERY    . TONSILLECTOMY      I have reviewed the social history and family history with the patient and they are unchanged from previous note.  ALLERGIES:  has No Known Allergies.  MEDICATIONS:  Current Outpatient Medications  Medication Sig Dispense Refill  . anastrozole (ARIMIDEX) 1  MG tablet Take 1 tablet (1 mg total) by mouth daily. 30 tablet 3  . ARIPiprazole (ABILIFY) 5 MG tablet Take 5 mg by mouth daily. In the morning.    . Ascorbic Acid (VITAMIN C) 1000 MG tablet Take 1,000 mg by mouth daily.    . carvedilol (COREG) 12.5 MG tablet Take 12.5 mg by mouth daily.     . DULoxetine (CYMBALTA) 60 MG capsule Take 60 mg by mouth daily.    Marland Kitchen ibuprofen (ADVIL,MOTRIN) 200 MG tablet Take 400 mg by mouth every 8 (eight) hours as needed for mild pain (for pain .).     Marland Kitchen loratadine  (CLARITIN) 10 MG tablet Take 10 mg by mouth daily.    Marland Kitchen LORazepam (ATIVAN) 1 MG tablet Take 1 mg by mouth every 8 (eight) hours as needed for anxiety.     . meloxicam (MOBIC) 7.5 MG tablet Take 15 mg by mouth daily.  4  . olmesartan-hydrochlorothiazide (BENICAR HCT) 40-25 MG tablet Take 1 tablet by mouth at bedtime.     Marland Kitchen omeprazole (PRILOSEC) 40 MG capsule Take 40 mg by mouth daily.    Marland Kitchen oxyCODONE (OXY IR/ROXICODONE) 5 MG immediate release tablet Take 0.5-1 tablets (2.5-5 mg total) by mouth every 4 (four) hours as needed for severe pain. 10 tablet 0  . Probiotic Product (PROBIOTIC PO) Take 1 capsule by mouth daily.    . traZODone (DESYREL) 50 MG tablet Take 50 mg by mouth at bedtime.     No current facility-administered medications for this visit.     PHYSICAL EXAMINATION: ECOG PERFORMANCE STATUS: 0 - Asymptomatic  Vitals:   07/27/18 1253  BP: 122/77  Pulse: 65  Resp: 18  Temp: 97.9 F (36.6 C)  SpO2: 100%   Filed Weights   07/27/18 1253  Weight: 153 lb 1.6 oz (69.4 kg)    GENERAL:alert, no distress and comfortable SKIN: skin color, texture, turgor are normal, no rashes or significant lesions EYES: normal, Conjunctiva are pink and non-injected, sclera clear OROPHARYNX:no exudate, no erythema and lips, buccal mucosa, and tongue normal  NECK: supple, thyroid normal size, non-tender, without nodularity LYMPH:  no palpable lymphadenopathy in the cervical, axillary or inguinal LUNGS: clear to auscultation and percussion with normal breathing effort HEART: regular rate & rhythm and no murmurs and no lower extremity edema ABDOMEN:abdomen soft, non-tender and normal bowel sounds Musculoskeletal:no cyanosis of digits and no clubbing  NEURO: alert & oriented x 3 with fluent speech, no focal motor/sensory deficits Breasts: Breast inspection showed them to be symmetrical with no nipple discharge. Palpation of the breasts and axilla revealed no obvious mass that I could appreciate.   Surgical scar in the left breast around areolaand axilla have healed very well without any significant scar tissues.   LABORATORY DATA:  I have reviewed the data as listed CBC Latest Ref Rng & Units 07/27/2018 02/21/2018 02/15/2018  WBC 4.0 - 10.5 K/uL 7.9 - 8.8  Hemoglobin 12.0 - 15.0 g/dL 12.7 13.3 14.1  Hematocrit 36.0 - 46.0 % 38.4 39.0 42.2  Platelets 150 - 400 K/uL 210 - 257     CMP Latest Ref Rng & Units 07/27/2018 02/21/2018 02/15/2018  Glucose 70 - 99 mg/dL 97 85 101(H)  BUN 8 - 23 mg/dL 17 - 10  Creatinine 0.44 - 1.00 mg/dL 0.94 - 0.86  Sodium 135 - 145 mmol/L 133(L) 129(L) 133(L)  Potassium 3.5 - 5.1 mmol/L 3.8 3.3(L) 2.7(LL)  Chloride 98 - 111 mmol/L 93(L) - 94(L)  CO2 22 -  32 mmol/L 32 - 27  Calcium 8.9 - 10.3 mg/dL 9.4 - 9.5  Total Protein 6.5 - 8.1 g/dL 6.4(L) - -  Total Bilirubin 0.3 - 1.2 mg/dL 0.4 - -  Alkaline Phos 38 - 126 U/L 87 - -  AST 15 - 41 U/L 15 - -  ALT 0 - 44 U/L 15 - -      RADIOGRAPHIC STUDIES: I have personally reviewed the radiological images as listed and agreed with the findings in the report. No results found.   07/12/2018 DEXA ASSESSMENT: The BMD measured at Forearm Radius 33% is 0.745 g/cm2 with a T-score of -1.6. This patient is considered osteopenic according to [facility]) criteria.  02/21/2018 Mammogram FINDINGS: Status post excision of the left breast. The wire tip and biopsy marker clip are present and are marked for pathology. The locations of the radioactive seed and biopsy marker clip within the specimen were discussed with the OR staff during the procedure.  IMPRESSION: Specimen radiograph of the left breast.  ASSESSMENT & PLAN:  Arnitra Sokoloski is a 73 y.o. female with history of  1. 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 Treated with lumpectomy and radiation.  -She has  started adjuvant anastrozole a few weeks ago, tolerating very well so far, without any noticeable side effects. -We again reviewed the potential side effect from anastrozole, especially hot flash, arthralgia, weight gain, mood swing, etc., she knows what to watch. -Lab reviewed, her CBC and CMP are within normal limits, exam was unremarkable, no clinical concern for recurrence -Continue breast cancer surveillance, her next mammogram is due in June.  Will see her back in 4 months.  2. Osteopenia of arm  -DEXA on 07/2018 revealed T score of -1.6 of forearm radius  -Her estimated risk of fracture is not very high, no need for biphosphonate for now -I encouraged her to continue calcium and vitamin D, and weightbearing exercise.  She agrees.  Plan  -Continue anastrozole, she is tolerating well -Lab and follow-up with NP Lacie in 4 months  No problem-specific Assessment & Plan notes found for this encounter.   No orders of the defined types were placed in this encounter.  All questions were answered. The patient knows to call the clinic with any problems, questions or concerns. No barriers to learning was detected. I spent 15 minutes counseling the patient face to face. The total time spent in the appointment was 20 minutes and more than 50% was on counseling and review of test results  I, Noor Dweik am acting as scribe for Dr. Truitt Merle.  I have reviewed the above documentation for accuracy and completeness, and I agree with the above.     Truitt Merle, MD 07/27/2018

## 2018-07-27 ENCOUNTER — Inpatient Hospital Stay: Payer: Medicare Other

## 2018-07-27 ENCOUNTER — Encounter: Payer: Self-pay | Admitting: Hematology

## 2018-07-27 ENCOUNTER — Inpatient Hospital Stay: Payer: Medicare Other | Attending: Hematology | Admitting: Hematology

## 2018-07-27 ENCOUNTER — Other Ambulatory Visit: Payer: Self-pay | Admitting: Hematology

## 2018-07-27 VITALS — BP 122/77 | HR 65 | Temp 97.9°F | Resp 18 | Ht 67.0 in | Wt 153.1 lb

## 2018-07-27 DIAGNOSIS — C50312 Malignant neoplasm of lower-inner quadrant of left female breast: Secondary | ICD-10-CM | POA: Diagnosis not present

## 2018-07-27 DIAGNOSIS — Z90710 Acquired absence of both cervix and uterus: Secondary | ICD-10-CM | POA: Diagnosis not present

## 2018-07-27 DIAGNOSIS — M8580 Other specified disorders of bone density and structure, unspecified site: Secondary | ICD-10-CM | POA: Insufficient documentation

## 2018-07-27 DIAGNOSIS — Z923 Personal history of irradiation: Secondary | ICD-10-CM | POA: Diagnosis not present

## 2018-07-27 DIAGNOSIS — Z79899 Other long term (current) drug therapy: Secondary | ICD-10-CM | POA: Insufficient documentation

## 2018-07-27 DIAGNOSIS — Z170 Estrogen receptor positive status [ER+]: Principal | ICD-10-CM

## 2018-07-27 DIAGNOSIS — Z79811 Long term (current) use of aromatase inhibitors: Secondary | ICD-10-CM | POA: Insufficient documentation

## 2018-07-27 LAB — CBC WITH DIFFERENTIAL (CANCER CENTER ONLY)
Abs Immature Granulocytes: 0.05 10*3/uL (ref 0.00–0.07)
BASOS ABS: 0.1 10*3/uL (ref 0.0–0.1)
Basophils Relative: 1 %
Eosinophils Absolute: 0.1 10*3/uL (ref 0.0–0.5)
Eosinophils Relative: 2 %
HEMATOCRIT: 38.4 % (ref 36.0–46.0)
HEMOGLOBIN: 12.7 g/dL (ref 12.0–15.0)
IMMATURE GRANULOCYTES: 1 %
LYMPHS ABS: 1.5 10*3/uL (ref 0.7–4.0)
LYMPHS PCT: 19 %
MCH: 26 pg (ref 26.0–34.0)
MCHC: 33.1 g/dL (ref 30.0–36.0)
MCV: 78.7 fL — ABNORMAL LOW (ref 80.0–100.0)
Monocytes Absolute: 0.8 10*3/uL (ref 0.1–1.0)
Monocytes Relative: 10 %
NEUTROS ABS: 5.4 10*3/uL (ref 1.7–7.7)
NEUTROS PCT: 67 %
NRBC: 0 % (ref 0.0–0.2)
Platelet Count: 210 10*3/uL (ref 150–400)
RBC: 4.88 MIL/uL (ref 3.87–5.11)
RDW: 14.1 % (ref 11.5–15.5)
WBC: 7.9 10*3/uL (ref 4.0–10.5)

## 2018-07-27 LAB — CMP (CANCER CENTER ONLY)
ALT: 15 U/L (ref 0–44)
AST: 15 U/L (ref 15–41)
Albumin: 3.7 g/dL (ref 3.5–5.0)
Alkaline Phosphatase: 87 U/L (ref 38–126)
Anion gap: 8 (ref 5–15)
BUN: 17 mg/dL (ref 8–23)
CHLORIDE: 93 mmol/L — AB (ref 98–111)
CO2: 32 mmol/L (ref 22–32)
Calcium: 9.4 mg/dL (ref 8.9–10.3)
Creatinine: 0.94 mg/dL (ref 0.44–1.00)
GFR, Estimated: >60 mL/min (ref >60)
Glucose, Bld: 97 mg/dL (ref 70–99)
POTASSIUM: 3.8 mmol/L (ref 3.5–5.1)
SODIUM: 133 mmol/L — AB (ref 135–145)
Total Bilirubin: 0.4 mg/dL (ref 0.3–1.2)
Total Protein: 6.4 g/dL — ABNORMAL LOW (ref 6.5–8.1)

## 2018-07-28 ENCOUNTER — Telehealth: Payer: Self-pay | Admitting: Hematology

## 2018-07-28 NOTE — Telephone Encounter (Signed)
Scheduled appt per 01/23 los. Called patient and spoke with patient spouse.  Spouse stated he will let the patient know her appt time and date.

## 2018-08-10 ENCOUNTER — Telehealth: Payer: Self-pay | Admitting: Hematology

## 2018-08-10 NOTE — Telephone Encounter (Signed)
Called regarding cancellation per Dr. Burr Medico will reschedule upon Lacie return

## 2018-10-10 ENCOUNTER — Other Ambulatory Visit: Payer: Self-pay | Admitting: Hematology

## 2018-10-10 DIAGNOSIS — C50312 Malignant neoplasm of lower-inner quadrant of left female breast: Secondary | ICD-10-CM

## 2018-10-10 DIAGNOSIS — Z170 Estrogen receptor positive status [ER+]: Principal | ICD-10-CM

## 2018-10-30 ENCOUNTER — Encounter: Payer: Medicare Other | Admitting: Adult Health

## 2018-11-21 ENCOUNTER — Telehealth: Payer: Self-pay | Admitting: Hematology

## 2018-11-21 NOTE — Telephone Encounter (Signed)
Cancelled appt per 5/19 sch message - called patient to confirm . Left message for patient to call back if they were wanting to reschedule appt

## 2018-11-28 ENCOUNTER — Ambulatory Visit: Payer: Medicare Other | Admitting: Nurse Practitioner

## 2018-11-28 ENCOUNTER — Other Ambulatory Visit: Payer: Medicare Other

## 2018-12-12 ENCOUNTER — Telehealth: Payer: Self-pay | Admitting: Hematology

## 2018-12-12 NOTE — Telephone Encounter (Signed)
Called patient re rescheduling April SCP visit and saw that patient called and cancelled 5/26 lab/fu.  Spoke with patient re rescheduling both appointments and per patient she would like to reschedule lab/YF for July but not SCP visit at this time. Gave patient appointment for lab/fu 7/10 and per patient she will speak with YF re SCP at 7/10 visit.

## 2019-01-02 ENCOUNTER — Other Ambulatory Visit: Payer: Self-pay | Admitting: Hematology

## 2019-01-02 DIAGNOSIS — C50312 Malignant neoplasm of lower-inner quadrant of left female breast: Secondary | ICD-10-CM

## 2019-01-04 NOTE — Progress Notes (Signed)
[name]   [phone] [phone]   Clinic Follow up Note   Patient Care Team: Burnard Bunting, MD as PCP - General (Internal Medicine) Gery Pray, MD as Consulting Physician (Radiation Oncology) Truitt Merle, MD as Consulting Physician (Hematology) Stark Klein, MD as Consulting Physician (General Surgery)  Date of Service:  01/12/2019  CHIEF COMPLAINT: F/u on left breast cancer  SUMMARY OF ONCOLOGIC HISTORY: Oncology History Overview Note  Cancer Staging Malignant neoplasm of lower-inner quadrant of left breast in female, estrogen receptor positive ([place]) Staging form: Breast, AJCC 8th Edition - Clinical stage from 01/16/2018: Stage IA (cT1b, cN0, cM0, G1, ER+, PR+, HER2-) - Signed by Truitt Merle, MD on 01/25/2018 - Pathologic stage from 02/21/2018: Stage IA (pT1c, pN0, cM0, G1, ER+, PR+, HER2-, Oncotype DX score: 18) - Signed by Truitt Merle, MD on 04/20/2018     Malignant neoplasm of lower-inner quadrant of left breast in female, estrogen receptor positive ([place])  01/16/2018 Mammogram   Diagnostic Mammogram of left breast 01/16/18 IMPRESSION: 1.  There is a suspicious mass measuring 7x6x7 mm in the left breast at 8 o'clock position 1 cm from the nipple. 2.  No evidence of left axillary lymphadenopathy. RECOMMENDATION: Ultrasound guided biopsy is recommended for the left breast mass.    01/16/2018 Initial Biopsy   Diagnosis 01/16/18 Breast, left, needle core biopsy, 8 o'clock - INVASIVE DUCTAL CARCINOMA. - DUCTAL CARCINOMA IN SITU.    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 T AIP,ALK, APC, ATM, AXIN2,BAP1,  BARD1, BLM, BMPR1A, BRCA1, BRCA2, BRIP1, BUB1B, CASR, CDC73, CDH1, CDK4, CDKN1B, CDKN1C, CDKN2A (p14ARF), CDKN2A (p16INK4a), CEBPA, CEP57, CHEK2, CTNNA1, DICER1, DIS3L2, EGFR (c.2369C>T, p.Thr790Met variant only), ENG, EPCAM (Deletion/duplication testing only), FH, FLCN, GATA2, GALNT12, GPC3, GREM1 (Promoter region deletion/duplication testing only), HOXB13 (c.251G>A, p.Gly84Glu), HRAS, KIT, MAX, MEN1, MET, MITF (c.952G>A, p.Glu318Lys variant only), MLH1, MSH2, MSH3, MSH6, MUTYH, NBN, NF1, NF2, NTHL1, PALB2, PDGFRA, PHOX2B, PMS2, POLD1, POLE, POT1, PRKAR1A, PTCH1, PTEN, RAD50, RAD51C, RAD51D, RB1, RECQL4, RET, RUNX1, RSP20, RNF43, SDHAF2, SDHA (sequence changes only),SDHAF2, SDHB, SDHC, SDHD, SMAD4, SMARCA4, SMARCB1, SMARCE1, STK11, SUFU, TERC, TERT, TMEM127, TP53, TSC1, TSC2, VHL, WRN and WT1.   Results: No pathogenic variants identified.  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  The date of this test report is 02/09/2018.     02/21/2018 Surgery   She had right breast lumpectomy with Dr. Barry Dienes on 02/21/2018.   02/21/2018 Pathology Results   02/21/2018 Surgical Pathology Diagnosis 1. Breast, lumpectomy, Left - INVASIVE AND IN SITU DUCTAL CARCINOMA, 1.2 CM, GRADE I. - MARGINS NOT INVOLVED. - PREVIOUS BIOPSY SITE AND BIOPSY CLIP. - FIBROCYSTIC CHANGES WITH USUAL DUCTAL HYPERPLASIA. 2. Lymph node, sentinel, biopsy, Left Axillary #1 - ONE BENIGN LYMPH NODE (0/1). 3. Lymph node, sentinel, biopsy, Left Axillary #2 - ONE BENIGN LYMPH NODE (  0/1).   02/21/2018 Oncotype testing    Oncotype:  Recurrence score 18  Distant recurrence risk at 9 years with Tamoxifen or AI alone is 5% Less than 1% benefit from adjuvant chemotherapy    02/21/2018 Cancer Staging   Staging form: Breast, AJCC 8th Edition - Pathologic stage from 02/21/2018: Stage IA (pT1c, pN0, cM0, G1, ER+, PR+, HER2-, Oncotype DX score: 18) - Signed by Truitt Merle, MD on 04/20/2018   02/21/2018 Mammogram   02/21/2018 Mammogram FINDINGS: Status post excision of the left breast. The wire tip and biopsy marker clip are present and are marked for pathology. The locations of the radioactive seed and biopsy marker clip within the specimen were discussed with the OR staff during the procedure.  IMPRESSION: Specimen radiograph of the left breast.   03/27/2018 - 04/21/2018 Radiation Therapy   she started daily radiation therapy with Dr. Sondra Come on 03/27/2018-04/21/18    07/12/2018 Imaging   07/12/2018 DEXA ASSESSMENT: The BMD measured at Forearm Radius 33% is 0.745 g/cm2 with a T-score of -1.6. This patient is considered osteopenic according to [facility]) criteria.      CURRENT THERAPY:  Adjuvant anastrozole, started 07/2018  INTERVAL HISTORY:  [name] is here for a follow up left breast cancer. She was last seen by me 7 months ago. She presents to the clinic alone. She notes she is doing well. She notes she is tolerating anastrozole well. She feels fatigued some days but this resolves as the day goes. She notes occasional stiffness in the morning. She has been active with her granddaughter daily and walking. She notes she is eating adequately even with fleeting nausea occasionally.  She notes she has anemia when she was young and was on oral iron before. She is willing start again.   REVIEW OF SYSTEMS:   Constitutional: Denies fevers, chills or abnormal weight loss (+) occasional fatigue (+) rare hot flashes Eyes: Denies blurriness of vision Ears, nose, mouth, throat, and  face: Denies mucositis or sore throat Respiratory: Denies cough, dyspnea or wheezes Cardiovascular: Denies palpitation, chest discomfort or lower extremity swelling Gastrointestinal:  Denies heartburn or change in bowel habits (+) fleeting nausea occasionally Skin: Denies abnormal skin rashes MSK: (+) Joint stiffness  Lymphatics: Denies new lymphadenopathy or easy bruising Neurological:Denies numbness, tingling or new weaknesses Behavioral/Psych: Mood is stable, no new changes  All other systems were reviewed with the patient and are negative.  MEDICAL HISTORY:  Past Medical History:  Diagnosis Date  . Anxiety   . Arthritis   . Depression   . Family history of breast cancer   . Family history of colon cancer   . Family history of leukemia   . Hypertension     SURGICAL HISTORY: Past Surgical History:  Procedure Laterality Date  . BACK SURGERY    . BREAST BIOPSY Left 03/10/2010   benign  . BREAST LUMPECTOMY WITH RADIOACTIVE SEED AND SENTINEL LYMPH NODE BIOPSY Left 02/21/2018   Procedure: BREAST LUMPECTOMY WITH RADIOACTIVE SEED AND SENTINEL LYMPH NODE BIOPSY;  Surgeon: Stark Klein, MD;  Location: [facility];  Service: General;  Laterality: Left;  . CHOLECYSTECTOMY    . HYSTERECTOMY ABDOMINAL WITH SALPINGECTOMY    . NECK SURGERY    . TONSILLECTOMY      I have reviewed the social history and family history with the patient and they are unchanged from previous note.  ALLERGIES:  has No Known Allergies.  MEDICATIONS:  Current Outpatient Medications  Medication Sig Dispense Refill  .  anastrozole (ARIMIDEX) 1 MG tablet TAKE 1 TABLET BY MOUTH EVERY DAY 90 tablet 0  . ARIPiprazole (ABILIFY) 5 MG tablet Take 5 mg by mouth daily. In the morning.    . Ascorbic Acid (VITAMIN C) 1000 MG tablet Take 1,000 mg by mouth daily.    . carvedilol (COREG) 12.5 MG tablet Take 12.5 mg by mouth daily.     . DULoxetine (CYMBALTA) 60 MG capsule Take 60 mg by mouth daily.    Marland Kitchen ibuprofen (ADVIL,MOTRIN)  200 MG tablet Take 400 mg by mouth every 8 (eight) hours as needed for mild pain (for pain .).     Marland Kitchen loratadine (CLARITIN) 10 MG tablet Take 10 mg by mouth daily.    Marland Kitchen LORazepam (ATIVAN) 1 MG tablet Take 1 mg by mouth every 8 (eight) hours as needed for anxiety.     . meloxicam (MOBIC) 7.5 MG tablet Take 15 mg by mouth daily.  4  . olmesartan-hydrochlorothiazide (BENICAR HCT) 40-25 MG tablet Take 1 tablet by mouth at bedtime.     Marland Kitchen omeprazole (PRILOSEC) 40 MG capsule Take 40 mg by mouth daily.    Marland Kitchen oxyCODONE (OXY IR/ROXICODONE) 5 MG immediate release tablet Take 0.5-1 tablets (2.5-5 mg total) by mouth every 4 (four) hours as needed for severe pain. 10 tablet 0  . Probiotic Product (PROBIOTIC PO) Take 1 capsule by mouth daily.    . traZODone (DESYREL) 50 MG tablet Take 50 mg by mouth at bedtime.     No current facility-administered medications for this visit.     PHYSICAL EXAMINATION: ECOG PERFORMANCE STATUS: 0 - Asymptomatic  There were no vitals filed for this visit. There were no vitals filed for this visit.  GENERAL:alert, no distress and comfortable SKIN: skin color, texture, turgor are normal, no rashes or significant lesions EYES: normal, Conjunctiva are pink and non-injected, sclera clear  NECK: supple, thyroid normal size, non-tender, without nodularity LYMPH:  no palpable lymphadenopathy in the cervical, axillary  LUNGS: clear to auscultation and percussion with normal breathing effort HEART: regular rate & rhythm and no murmurs and no lower extremity edema ABDOMEN:abdomen soft, non-tender and normal bowel sounds (+) Mild epigastric tenderness, she is s/p cholecystectomy (+) no hepatomegaly  Musculoskeletal:no cyanosis of digits and no clubbing  NEURO: alert & oriented x 3 with fluent speech, no focal motor/sensory deficits BREAST: S/p left lumpectomy: surgical incision healed very well. (+) No palpable mass or adenopathy of either breasts and axilla.   LABORATORY DATA:  I  have reviewed the data as listed CBC Latest Ref Rng & Units 01/12/2019 07/27/2018 02/21/2018  WBC 4.0 - 10.5 K/uL 8.0 7.9 -  Hemoglobin 12.0 - 15.0 g/dL 11.9(L) 12.7 13.3  Hematocrit 36.0 - 46.0 % 36.8 38.4 39.0  Platelets 150 - 400 K/uL 252 210 -     CMP Latest Ref Rng & Units 01/12/2019 07/27/2018 02/21/2018  Glucose 70 - 99 mg/dL 92 97 85  BUN 8 - 23 mg/dL 16 17 -  Creatinine 0.44 - 1.00 mg/dL 0.89 0.94 -  Sodium 135 - 145 mmol/L 133(L) 133(L) 129(L)  Potassium 3.5 - 5.1 mmol/L 3.3(L) 3.8 3.3(L)  Chloride 98 - 111 mmol/L 95(L) 93(L) -  CO2 22 - 32 mmol/L 27 32 -  Calcium 8.9 - 10.3 mg/dL 8.8(L) 9.4 -  Total Protein 6.5 - 8.1 g/dL 6.3(L) 6.4(L) -  Total Bilirubin 0.3 - 1.2 mg/dL 0.4 0.4 -  Alkaline Phos 38 - 126 U/L 81 87 -  AST 15 - 41  U/L 20 15 -  ALT 0 - 44 U/L 14 15 -      RADIOGRAPHIC STUDIES: I have personally reviewed the radiological images as listed and agreed with the findings in the report. No results found.   ASSESSMENT & PLAN:  [name] is a 73 y.o. female with   1. 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 Treated with left lumpectomy and radiation.  -She has started adjuvant anastrozole in 07/2018, tolerating very well so far, with only a rare hot flashes and mild joint stiffness in the AM.  -She is clinically doing well. Lab reviewed, her CBC and CMP are within normal limits except Hg 11.9, K 3.3, Ca 8.8, protein 6.3. Her physical exam was unremarkable. There is no clinical concern for recurrence -Continue breast cancer surveillance, She is due for mammogram this year, will order.  -Continue Anastrozole  -F/u in 6 months. She declined survivorship clinic.   2. New microcytic anemia  -Per patient notes she had anemia when young and use to take oral iron.  -Her last colonoscopy had several polyps. She did have GI bleeding in the past.  She does have hemorrhoids and was offered banding.  -She plans to have colonoscopy in 2021.  -This is likely iron deficient anemia. I recommend she start OTC oral iron. I encouraged her to be careful of constipation.  -Will repeat labs with PCP in 1-2 months. If still low, she may do colonoscopy sooner this year.    3. Osteopenia of left arm  -DEXA on 07/2018 revealed T score of -1.6 of left forearm radius  -Her estimated risk of fracture is not very high, no need for bisphosphonate for now -She will continue oral calcium and vitamin D.    Plan  -Start oral iron daily. Will send message to her PCP Dr. Reynaldo Minium about repeating CBC and iron panel on her next visit   -Mammogram to be done this year -Continue anastrozole -Lab and follow-up in 6 months  -[facility] this [facility]    No problem-specific Assessment & Plan notes found for this encounter.   Orders Placed This Encounter  Procedures  . MM DIAG BREAST TOMO BILATERAL    Standing Status:   Future    Standing Expiration Date:   01/12/2020    Order Specific Question:   Reason for Exam (SYMPTOM  OR DIAGNOSIS REQUIRED)    Answer:   screening    Order Specific Question:   Preferred imaging location?    Answer:   [facility]   All questions were answered. The patient knows to call the clinic with any problems, questions or concerns. No barriers to learning was detected. I spent 15 minutes counseling the patient face to face. The total time spent in the appointment was 20 minutes and more than 50% was on counseling and review of test results     Truitt Merle, MD 01/12/2019   I, Joslyn Devon, am acting as scribe for Truitt Merle, MD.   I have reviewed the above documentation for accuracy and completeness, and I agree with the above.

## 2019-01-12 ENCOUNTER — Other Ambulatory Visit: Payer: Self-pay

## 2019-01-12 ENCOUNTER — Encounter: Payer: Self-pay | Admitting: Hematology

## 2019-01-12 ENCOUNTER — Inpatient Hospital Stay: Payer: Medicare Other

## 2019-01-12 ENCOUNTER — Inpatient Hospital Stay: Payer: Medicare Other | Attending: Hematology | Admitting: Hematology

## 2019-01-12 DIAGNOSIS — M8580 Other specified disorders of bone density and structure, unspecified site: Secondary | ICD-10-CM | POA: Insufficient documentation

## 2019-01-12 DIAGNOSIS — C50312 Malignant neoplasm of lower-inner quadrant of left female breast: Secondary | ICD-10-CM

## 2019-01-12 DIAGNOSIS — Z923 Personal history of irradiation: Secondary | ICD-10-CM | POA: Diagnosis not present

## 2019-01-12 DIAGNOSIS — D509 Iron deficiency anemia, unspecified: Secondary | ICD-10-CM | POA: Diagnosis not present

## 2019-01-12 DIAGNOSIS — Z170 Estrogen receptor positive status [ER+]: Secondary | ICD-10-CM

## 2019-01-12 DIAGNOSIS — Z79811 Long term (current) use of aromatase inhibitors: Secondary | ICD-10-CM | POA: Insufficient documentation

## 2019-01-12 LAB — CMP (CANCER CENTER ONLY)
ALT: 14 U/L (ref 0–44)
AST: 20 U/L (ref 15–41)
Albumin: 3.7 g/dL (ref 3.5–5.0)
Alkaline Phosphatase: 81 U/L (ref 38–126)
Anion gap: 11 (ref 5–15)
BUN: 16 mg/dL (ref 8–23)
CO2: 27 mmol/L (ref 22–32)
Calcium: 8.8 mg/dL — ABNORMAL LOW (ref 8.9–10.3)
Chloride: 95 mmol/L — ABNORMAL LOW (ref 98–111)
Creatinine: 0.89 mg/dL (ref 0.44–1.00)
GFR, Est AFR Am: >60 mL/min (ref >60)
GFR, Estimated: >60 mL/min (ref >60)
Glucose, Bld: 92 mg/dL (ref 70–99)
Potassium: 3.3 mmol/L — ABNORMAL LOW (ref 3.5–5.1)
Sodium: 133 mmol/L — ABNORMAL LOW (ref 135–145)
Total Bilirubin: 0.4 mg/dL (ref 0.3–1.2)
Total Protein: 6.3 g/dL — ABNORMAL LOW (ref 6.5–8.1)

## 2019-01-12 LAB — CBC WITH DIFFERENTIAL (CANCER CENTER ONLY)
Abs Immature Granulocytes: 0.02 10*3/uL (ref 0.00–0.07)
Basophils Absolute: 0 10*3/uL (ref 0.0–0.1)
Basophils Relative: 1 %
Eosinophils Absolute: 0.2 10*3/uL (ref 0.0–0.5)
Eosinophils Relative: 2 %
HCT: 36.8 % (ref 36.0–46.0)
Hemoglobin: 11.9 g/dL — ABNORMAL LOW (ref 12.0–15.0)
Immature Granulocytes: 0 %
Lymphocytes Relative: 22 %
Lymphs Abs: 1.8 10*3/uL (ref 0.7–4.0)
MCH: 24.7 pg — ABNORMAL LOW (ref 26.0–34.0)
MCHC: 32.3 g/dL (ref 30.0–36.0)
MCV: 76.5 fL — ABNORMAL LOW (ref 80.0–100.0)
Monocytes Absolute: 0.7 10*3/uL (ref 0.1–1.0)
Monocytes Relative: 9 %
Neutro Abs: 5.3 10*3/uL (ref 1.7–7.7)
Neutrophils Relative %: 66 %
Platelet Count: 252 10*3/uL (ref 150–400)
RBC: 4.81 MIL/uL (ref 3.87–5.11)
RDW: 14.6 % (ref 11.5–15.5)
WBC Count: 8 10*3/uL (ref 4.0–10.5)
nRBC: 0 % (ref 0.0–0.2)

## 2019-01-15 ENCOUNTER — Telehealth: Payer: Self-pay | Admitting: Hematology

## 2019-01-15 ENCOUNTER — Other Ambulatory Visit: Payer: Self-pay | Admitting: *Deleted

## 2019-01-15 ENCOUNTER — Telehealth: Payer: Self-pay | Admitting: *Deleted

## 2019-01-15 MED ORDER — POTASSIUM CHLORIDE CRYS ER 20 MEQ PO TBCR: 20.0000 meq | EXTENDED_RELEASE_TABLET | Freq: Every day | ORAL | 0 refills | Status: DC

## 2019-01-15 NOTE — Telephone Encounter (Signed)
No los per 7/10. °

## 2019-01-15 NOTE — Telephone Encounter (Signed)
TCT patient regarding lab results from 01/12/19. She is not home but was able to speak with patient's husband.  Advised that her potassium was low last week - 3.3.  Informed husband that a prescription for K+ supplement will be called in to her pharmacy. She is to take 1 tablet daily for 7 days. She is to increase K+ rich foods. Reviewed foods with husband.  He voiced understanding and will pass this information on to his wife.

## 2019-01-30 ENCOUNTER — Ambulatory Visit
Admission: RE | Admit: 2019-01-30 | Discharge: 2019-01-30 | Disposition: A | Discharge status: Home / Self Care | Payer: Medicare Other | Source: Ambulatory Visit | Attending: Hematology | Admitting: Hematology

## 2019-01-30 ENCOUNTER — Other Ambulatory Visit: Payer: Self-pay

## 2019-01-30 DIAGNOSIS — C50312 Malignant neoplasm of lower-inner quadrant of left female breast: Secondary | ICD-10-CM

## 2019-01-30 DIAGNOSIS — Z170 Estrogen receptor positive status [ER+]: Secondary | ICD-10-CM

## 2019-02-09 ENCOUNTER — Telehealth: Payer: Self-pay | Admitting: Hematology

## 2019-02-09 NOTE — Telephone Encounter (Signed)
Dr. Barry Dienes informed me that she saw her today for routine f/u, and pt reported she has stopped Anastrozole due to worsening fatigue. I called her. She states her fatigued started about a month ago, and she feels like a "zombe", that's she has stopped Anastrozole. She when I saw her a month ago, she did take some oral iron per my recommendation, but did not continue.  I encouraged her to restart oral iron.  She has appointment with her primary care physician next week, will check she also has seen Dr. Watt Climes and to have hemorrhoid banding, and a repeat colonoscopy in near future.  I encouraged her to follow-up with Dr. Watt Climes.  I asked patient to call me back in one months to see if her fatigue improves, if she can restart anastrozole or switching her to a different AI.  She voiced good understanding and agrees with the plan.  [name]  02/09/2019

## 2019-03-06 ENCOUNTER — Telehealth: Payer: Self-pay | Admitting: *Deleted

## 2019-03-06 NOTE — Telephone Encounter (Signed)
Pt called stating she had physical and labs done with her PCP recently.  Stated all lab results were very good.   Stated she is doing good; however, pt would like to try another chemo pill since the current medication makes her very tired, more fatigue. This nurse contact pt's PCP office Dr. Reynaldo Minium, and requested most current lab results to be faxed to our office for Dr. Burr Medico to review. Pt's   Phone     [phone].

## 2019-03-08 ENCOUNTER — Other Ambulatory Visit: Payer: Self-pay | Admitting: Hematology

## 2019-03-08 ENCOUNTER — Telehealth: Payer: Self-pay | Admitting: Hematology

## 2019-03-08 MED ORDER — EXEMESTANE 25 MG PO TABS: 25.0000 mg | ORAL_TABLET | Freq: Every day | ORAL | 0 refills | Status: DC

## 2019-03-08 NOTE — Telephone Encounter (Signed)
I called her back and left a VM: lab reviewed, all good. I recommend her to stop anastrozole. If her fatigue is related to anastrozole, she should feel better within a week. I called in exemestane for her, she can start in a few weeks when she recovers. I explained the side effects could be similar to anstrazole, and call us if she experience significant AEs again. I will see her back in 2-3 months, schedule message sent.   Truitt Merle MD

## 2019-03-08 NOTE — Telephone Encounter (Signed)
Scheduled appt per 9/3 sch message - unable to reach pt . Left message with appt date and time

## 2019-03-26 ENCOUNTER — Other Ambulatory Visit: Payer: Self-pay | Admitting: Hematology

## 2019-03-26 DIAGNOSIS — Z170 Estrogen receptor positive status [ER+]: Secondary | ICD-10-CM

## 2019-03-26 DIAGNOSIS — C50312 Malignant neoplasm of lower-inner quadrant of left female breast: Secondary | ICD-10-CM

## 2019-03-31 ENCOUNTER — Other Ambulatory Visit: Payer: Self-pay | Admitting: Hematology

## 2019-04-05 ENCOUNTER — Other Ambulatory Visit: Payer: Self-pay | Admitting: Hematology

## 2019-05-07 ENCOUNTER — Telehealth: Payer: Self-pay | Admitting: Hematology

## 2019-05-07 ENCOUNTER — Telehealth: Payer: Self-pay

## 2019-05-07 NOTE — Telephone Encounter (Signed)
Patient called stating unable to keep her appointment on Wednesday 11/4 can't get off from work.  She would like to reschedule to a later day.  Taking anastrozole and doing fine with it no problems.  A scheduling message has been sent.

## 2019-05-07 NOTE — Telephone Encounter (Signed)
Called pt per 11/2 sch message - no answer - left message for patient to call back to reschedule.

## 2019-05-09 ENCOUNTER — Ambulatory Visit: Payer: Medicare Other | Admitting: Hematology

## 2019-05-24 NOTE — Progress Notes (Signed)
[place]   [phone] [phone]   Clinic Follow up Note   Patient Care Team: Burnard Bunting, MD as PCP - General (Internal Medicine) Gery Pray, MD as Consulting Physician (Radiation Oncology) Truitt Merle, MD as Consulting Physician (Hematology) Stark Klein, MD as Consulting Physician (General Surgery)  Date of Service:  05/25/2019   CHIEF COMPLAINT: F/u on left breast cancer  SUMMARY OF ONCOLOGIC HISTORY: Oncology History Overview Note  Cancer Staging Malignant neoplasm of lower-inner quadrant of left breast in female, estrogen receptor positive ([place]) Staging form: Breast, AJCC 8th Edition - Clinical stage from 01/16/2018: Stage IA (cT1b, cN0, cM0, G1, ER+, PR+, HER2-) - Signed by Truitt Merle, MD on 01/25/2018 - Pathologic stage from 02/21/2018: Stage IA (pT1c, pN0, cM0, G1, ER+, PR+, HER2-, Oncotype DX score: 18) - Signed by Truitt Merle, MD on 04/20/2018     Malignant neoplasm of lower-inner quadrant of left breast in female, estrogen receptor positive ([place])  01/16/2018 Mammogram   Diagnostic Mammogram of left breast 01/16/18 IMPRESSION: 1.  There is a suspicious mass measuring 7x6x7 mm in the left breast at 8 o'clock position 1 cm from the nipple. 2.  No evidence of left axillary lymphadenopathy. RECOMMENDATION: Ultrasound guided biopsy is recommended for the left breast mass.    01/16/2018 Initial Biopsy   Diagnosis 01/16/18 Breast, left, needle core biopsy, 8 o'clock - INVASIVE DUCTAL CARCINOMA. - DUCTAL CARCINOMA IN SITU.    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 T AIP,ALK, APC, ATM, AXIN2,BAP1,  BARD1, BLM, BMPR1A, BRCA1, BRCA2, BRIP1, BUB1B, CASR, CDC73, CDH1, CDK4, CDKN1B, CDKN1C, CDKN2A (p14ARF), CDKN2A (p16INK4a), CEBPA, CEP57, CHEK2, CTNNA1, DICER1, DIS3L2, EGFR (c.2369C>T, p.Thr790Met variant only), ENG, EPCAM (Deletion/duplication testing only), FH, FLCN, GATA2, GALNT12, GPC3, GREM1 (Promoter region deletion/duplication testing only), HOXB13 (c.251G>A, p.Gly84Glu), HRAS, KIT, MAX, MEN1, MET, MITF (c.952G>A, p.Glu318Lys variant only), MLH1, MSH2, MSH3, MSH6, MUTYH, NBN, NF1, NF2, NTHL1, PALB2, PDGFRA, PHOX2B, PMS2, POLD1, POLE, POT1, PRKAR1A, PTCH1, PTEN, RAD50, RAD51C, RAD51D, RB1, RECQL4, RET, RUNX1, RSP20, RNF43, SDHAF2, SDHA (sequence changes only),SDHAF2, SDHB, SDHC, SDHD, SMAD4, SMARCA4, SMARCB1, SMARCE1, STK11, SUFU, TERC, TERT, TMEM127, TP53, TSC1, TSC2, VHL, WRN and WT1.   Results: No pathogenic variants identified.  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  The date of this test report is 02/09/2018.     02/21/2018 Surgery   She had right breast lumpectomy with Dr. Barry Dienes on 02/21/2018.   02/21/2018 Pathology Results   02/21/2018 Surgical Pathology Diagnosis 1. Breast, lumpectomy, Left - INVASIVE AND IN SITU DUCTAL CARCINOMA, 1.2 CM, GRADE I. - MARGINS NOT INVOLVED. - PREVIOUS BIOPSY SITE AND BIOPSY CLIP. - FIBROCYSTIC CHANGES WITH USUAL DUCTAL HYPERPLASIA. 2. Lymph node, sentinel, biopsy, Left Axillary #1 - ONE BENIGN LYMPH NODE (0/1). 3. Lymph node, sentinel, biopsy, Left Axillary #2 - ONE BENIGN LYMPH  NODE (0/1).   02/21/2018 Oncotype testing    Oncotype:  Recurrence score 18  Distant recurrence risk at 9 years with Tamoxifen or AI alone is 5% Less than 1% benefit from adjuvant chemotherapy    02/21/2018 Cancer Staging   Staging form: Breast, AJCC 8th Edition - Pathologic stage from 02/21/2018: Stage IA (pT1c, pN0, cM0, G1, ER+, PR+, HER2-, Oncotype DX score: 18) - Signed by Truitt Merle, MD on 04/20/2018   02/21/2018 Mammogram   02/21/2018 Mammogram FINDINGS: Status post excision of the left breast. The wire tip and biopsy marker clip are present and are marked for pathology. The locations of the radioactive seed and biopsy marker clip within the specimen were discussed with the OR staff during the procedure.  IMPRESSION: Specimen radiograph of the left breast.   03/27/2018 - 04/21/2018 Radiation Therapy   she started daily radiation therapy with Dr. Sondra Come on 03/27/2018-04/21/18    07/12/2018 Imaging   07/12/2018 DEXA ASSESSMENT: The BMD measured at Forearm Radius 33% is 0.745 g/cm2 with a T-score of -1.6. This patient is considered osteopenic according to [place] [facility]) criteria.   07/2018 -  Anti-estrogen oral therapy   Adjuvant anastrozole, started 07/2018. Due to wroseneing fatigue she switched to Exemestane in 03/2019.       CURRENT THERAPY:  Adjuvant anastrozole, started 07/2018. Due to worsening fatigue she switched to Exemestane in 03/2019.   INTERVAL HISTORY:  [name] is here for a follow up of left breast cancer. She presents to the clinic alone. She notes she was doing well although she was at a family member's funeral today. She notes she has been on Anastrozole and oral iron and multivitamin. Since starting oral iron she feels well and no more fatigue.  She plans to have her next colonoscopy in early 2021 with Dr. Thana Farr. She denies and bloody stool.    REVIEW OF SYSTEMS:   Constitutional: Denies fevers, chills or abnormal weight loss Eyes: Denies blurriness of vision Ears, nose,  mouth, throat, and face: Denies mucositis or sore throat Respiratory: Denies cough, dyspnea or wheezes Cardiovascular: Denies palpitation, chest discomfort or lower extremity swelling Gastrointestinal:  Denies nausea, heartburn or change in bowel habits Skin: Denies abnormal skin rashes Lymphatics: Denies new lymphadenopathy or easy bruising Neurological:Denies numbness, tingling or new weaknesses Behavioral/Psych: Mood is stable, no new changes  All other systems were reviewed with the patient and are negative.  MEDICAL HISTORY:  Past Medical History:  Diagnosis Date  . Anxiety   . Arthritis   . Depression   . Family history of breast cancer   . Family history of colon cancer   . Family history of leukemia   . Hypertension     SURGICAL HISTORY: Past Surgical History:  Procedure Laterality Date  . BACK SURGERY    . BREAST BIOPSY Left 03/10/2010   benign  . BREAST LUMPECTOMY WITH RADIOACTIVE SEED AND SENTINEL LYMPH NODE BIOPSY Left 02/21/2018   Procedure: BREAST LUMPECTOMY WITH RADIOACTIVE SEED AND SENTINEL LYMPH NODE BIOPSY;  Surgeon: Stark Klein, MD;  Location: [place];  Service: General;  Laterality: Left;  . CHOLECYSTECTOMY    . HYSTERECTOMY ABDOMINAL WITH SALPINGECTOMY    . NECK SURGERY    . TONSILLECTOMY      I have reviewed the social history and family history with the patient and they are unchanged from previous note.  ALLERGIES:  has No Known Allergies.  MEDICATIONS:  Current Outpatient Medications  Medication Sig Dispense Refill  . ARIPiprazole (  ABILIFY) 5 MG tablet Take 5 mg by mouth daily. In the morning.    . Ascorbic Acid (VITAMIN C) 1000 MG tablet Take 1,000 mg by mouth daily.    . carvedilol (COREG) 12.5 MG tablet Take 12.5 mg by mouth daily.     . DULoxetine (CYMBALTA) 60 MG capsule Take 60 mg by mouth daily.    Marland Kitchen exemestane (AROMASIN) 25 MG tablet Take 1 tablet (25 mg total) by mouth daily after breakfast. 30 tablet 11  . ibuprofen (ADVIL,MOTRIN) 200  MG tablet Take 400 mg by mouth every 8 (eight) hours as needed for mild pain (for pain .).     Marland Kitchen loratadine (CLARITIN) 10 MG tablet Take 10 mg by mouth daily.    Marland Kitchen LORazepam (ATIVAN) 1 MG tablet Take 1 mg by mouth every 8 (eight) hours as needed for anxiety.     . meloxicam (MOBIC) 7.5 MG tablet Take 15 mg by mouth daily.  4  . olmesartan-hydrochlorothiazide (BENICAR HCT) 40-25 MG tablet Take 1 tablet by mouth at bedtime.     Marland Kitchen omeprazole (PRILOSEC) 40 MG capsule Take 40 mg by mouth daily.    Marland Kitchen oxyCODONE (OXY IR/ROXICODONE) 5 MG immediate release tablet Take 0.5-1 tablets (2.5-5 mg total) by mouth every 4 (four) hours as needed for severe pain. 10 tablet 0  . potassium chloride SA (K-DUR) 20 MEQ tablet Take 1 tablet (20 mEq total) by mouth daily. 7 tablet 0  . Probiotic Product (PROBIOTIC PO) Take 1 capsule by mouth daily.    . traZODone (DESYREL) 50 MG tablet Take 50 mg by mouth at bedtime.     No current facility-administered medications for this visit.     PHYSICAL EXAMINATION: ECOG PERFORMANCE STATUS: 0 - Asymptomatic  Vitals:   05/25/19 1140  BP: (!) 146/106  Pulse: 73  Resp: 18  Temp: 98.2 F (36.8 C)  SpO2: 98%   Filed Weights   05/25/19 1140  Weight: 157 lb 3.2 oz (71.3 kg)    GENERAL:alert, no distress and comfortable SKIN: skin color, texture, turgor are normal, no rashes or significant lesions EYES: normal, Conjunctiva are pink and non-injected, sclera clear  NECK: supple, thyroid normal size, non-tender, without nodularity LYMPH:  no palpable lymphadenopathy in the cervical, axillary  LUNGS: clear to auscultation and percussion with normal breathing effort HEART: regular rate & rhythm and no murmurs and no lower extremity edema ABDOMEN:abdomen soft, non-tender and normal bowel sounds Musculoskeletal:no cyanosis of digits and no clubbing  NEURO: alert & oriented x 3 with fluent speech, no focal motor/sensory deficits BREAST: S/p left lumpectomy: Surgical incision  healed well. No palpable mass, nodules or adenopathy bilaterally. Breast exam benign.    LABORATORY DATA:  I have reviewed the data as listed CBC Latest Ref Rng & Units 05/25/2019 01/12/2019 07/27/2018  WBC 4.0 - 10.5 K/uL 10.1 8.0 7.9  Hemoglobin 12.0 - 15.0 g/dL 13.4 11.9(L) 12.7  Hematocrit 36.0 - 46.0 % 40.6 36.8 38.4  Platelets 150 - 400 K/uL 261 252 210     CMP Latest Ref Rng & Units 05/25/2019 01/12/2019 07/27/2018  Glucose 70 - 99 mg/dL 101(H) 92 97  BUN 8 - 23 mg/dL _0 Creatinine 0.44 - 1.00 mg/dL 0.87 0.89 0.94  Sodium 135 - 145 mmol/L 131(L) 133(L) 133(L)  Potassium 3.5 - 5.1 mmol/L 3.4(L) 3.3(L) 3.8  Chloride 98 - 111 mmol/L 92(L) 95(L) 93(L)  CO2 22 - 32 mmol/L 28 27 32  Calcium 8.9 - 10.3 mg/dL 9.2  8.8(L) 9.4  Total Protein 6.5 - 8.1 g/dL 6.7 6.3(L) 6.4(L)  Total Bilirubin 0.3 - 1.2 mg/dL 0.4 0.4 0.4  Alkaline Phos 38 - 126 U/L 80 81 87  AST 15 - 41 U/L _0 ALT 0 - 44 U/L _1 RADIOGRAPHIC STUDIES: I have personally reviewed the radiological images as listed and agreed with the findings in the report. No results found.   ASSESSMENT & PLAN:  [name] is a 73 y.o. female with   1. 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 Treated with left lumpectomy and radiation. -She has started adjuvant anastrozole in 07/2018. Due to worsening fatigue she switched to Exemestane in 03/2019, tolerating very well. Continue for at least 5 years.  -She is clinically doing well. Her 02/2019 labs with PCP were reviewed. Her physical exam and her 01/2019 mammogram were unremarkable. There is no clinical concern for recurrence. -Continue surveillance. Next mammogram in 01/2020  -Continue Exemestane  -F/u in 8 months. She will f/u with her GI and PCP in interim.   2. Microcytic anemia  -Per patient she had anemia when young and use to  take oral iron.  -Her last colonoscopy had several polyps. She did have GI bleeding in the past. She does have hemorrhoids and was offered banding.  -She plans to have colonoscopy in early 2021.  -anemia and fatigue improved with oral iron, will continue.   3.Osteopenia of left arm -DEXA on 07/2018 revealed T score of -1.6of left forearmradius. Will repeat every 2 years to monitor on AI.  -Her estimated risk of fracture is not very high, no need for bisphosphonate for now -She will continue oral calcium and vitamin D.    Plan -Lab today  -She is clinically doing well  -Continue Exemestane  -phone visit in 4 months  -Lab and f/u in 8 months    No problem-specific Assessment & Plan notes found for this encounter.   No orders of the defined types were placed in this encounter.  All questions were answered. The patient knows to call the clinic with any problems, questions or concerns. No barriers to learning was detected. I spent 15 minutes counseling the patient face to face. The total time spent in the appointment was 20 minutes and more than 50% was on counseling and review of test results    Truitt Merle, MD 05/25/2019   I, Joslyn Devon, am acting as scribe for Truitt Merle, MD.   I have reviewed the above documentation for accuracy and completeness, and I agree with the above.

## 2019-05-25 ENCOUNTER — Other Ambulatory Visit: Payer: Self-pay

## 2019-05-25 ENCOUNTER — Inpatient Hospital Stay: Payer: Medicare Other | Attending: Hematology | Admitting: Hematology

## 2019-05-25 ENCOUNTER — Encounter: Payer: Self-pay | Admitting: Hematology

## 2019-05-25 ENCOUNTER — Inpatient Hospital Stay: Payer: Medicare Other

## 2019-05-25 VITALS — BP 146/106 | HR 73 | Temp 98.2°F | Resp 18 | Ht 67.0 in | Wt 157.2 lb

## 2019-05-25 DIAGNOSIS — C50312 Malignant neoplasm of lower-inner quadrant of left female breast: Secondary | ICD-10-CM | POA: Insufficient documentation

## 2019-05-25 DIAGNOSIS — Z79899 Other long term (current) drug therapy: Secondary | ICD-10-CM | POA: Insufficient documentation

## 2019-05-25 DIAGNOSIS — Z79811 Long term (current) use of aromatase inhibitors: Secondary | ICD-10-CM | POA: Diagnosis not present

## 2019-05-25 DIAGNOSIS — D509 Iron deficiency anemia, unspecified: Secondary | ICD-10-CM | POA: Diagnosis not present

## 2019-05-25 DIAGNOSIS — Z170 Estrogen receptor positive status [ER+]: Secondary | ICD-10-CM | POA: Diagnosis not present

## 2019-05-25 DIAGNOSIS — Z923 Personal history of irradiation: Secondary | ICD-10-CM | POA: Insufficient documentation

## 2019-05-25 DIAGNOSIS — M8580 Other specified disorders of bone density and structure, unspecified site: Secondary | ICD-10-CM | POA: Diagnosis not present

## 2019-05-25 LAB — CMP (CANCER CENTER ONLY)
ALT: 13 U/L (ref 0–44)
AST: 16 U/L (ref 15–41)
Albumin: 4.1 g/dL (ref 3.5–5.0)
Alkaline Phosphatase: 80 U/L (ref 38–126)
Anion gap: 11 (ref 5–15)
BUN: 13 mg/dL (ref 8–23)
CO2: 28 mmol/L (ref 22–32)
Calcium: 9.2 mg/dL (ref 8.9–10.3)
Chloride: 92 mmol/L — ABNORMAL LOW (ref 98–111)
Creatinine: 0.87 mg/dL (ref 0.44–1.00)
GFR, Est AFR Am: >60 mL/min (ref >60)
GFR, Estimated: >60 mL/min (ref >60)
Glucose, Bld: 101 mg/dL — ABNORMAL HIGH (ref 70–99)
Potassium: 3.4 mmol/L — ABNORMAL LOW (ref 3.5–5.1)
Sodium: 131 mmol/L — ABNORMAL LOW (ref 135–145)
Total Bilirubin: 0.4 mg/dL (ref 0.3–1.2)
Total Protein: 6.7 g/dL (ref 6.5–8.1)

## 2019-05-25 LAB — CBC WITH DIFFERENTIAL (CANCER CENTER ONLY)
Abs Immature Granulocytes: 0.06 10*3/uL (ref 0.00–0.07)
Basophils Absolute: 0.1 10*3/uL (ref 0.0–0.1)
Basophils Relative: 1 %
Eosinophils Absolute: 0.1 10*3/uL (ref 0.0–0.5)
Eosinophils Relative: 1 %
HCT: 40.6 % (ref 36.0–46.0)
Hemoglobin: 13.4 g/dL (ref 12.0–15.0)
Immature Granulocytes: 1 %
Lymphocytes Relative: 15 %
Lymphs Abs: 1.5 10*3/uL (ref 0.7–4.0)
MCH: 26.1 pg (ref 26.0–34.0)
MCHC: 33 g/dL (ref 30.0–36.0)
MCV: 79.1 fL — ABNORMAL LOW (ref 80.0–100.0)
Monocytes Absolute: 0.7 10*3/uL (ref 0.1–1.0)
Monocytes Relative: 7 %
Neutro Abs: 7.7 10*3/uL (ref 1.7–7.7)
Neutrophils Relative %: 75 %
Platelet Count: 261 10*3/uL (ref 150–400)
RBC: 5.13 MIL/uL — ABNORMAL HIGH (ref 3.87–5.11)
RDW: 14.4 % (ref 11.5–15.5)
WBC Count: 10.1 10*3/uL (ref 4.0–10.5)
nRBC: 0 % (ref 0.0–0.2)

## 2019-05-25 MED ORDER — EXEMESTANE 25 MG PO TABS: 25.0000 mg | ORAL_TABLET | Freq: Every day | ORAL | 11 refills | Status: DC

## 2019-05-28 ENCOUNTER — Telehealth: Payer: Self-pay | Admitting: Hematology

## 2019-05-28 NOTE — Telephone Encounter (Signed)
Scheduled appt per 11/20 los. ° °Left a vm of the appt date and time. °

## 2019-06-24 ENCOUNTER — Other Ambulatory Visit: Payer: Self-pay | Admitting: Hematology

## 2019-06-24 DIAGNOSIS — Z170 Estrogen receptor positive status [ER+]: Secondary | ICD-10-CM

## 2019-06-24 DIAGNOSIS — C50312 Malignant neoplasm of lower-inner quadrant of left female breast: Secondary | ICD-10-CM

## 2019-07-09 ENCOUNTER — Other Ambulatory Visit: Payer: Self-pay | Admitting: Hematology

## 2019-07-09 DIAGNOSIS — C50312 Malignant neoplasm of lower-inner quadrant of left female breast: Secondary | ICD-10-CM

## 2019-09-19 NOTE — Progress Notes (Signed)
[place]   [phone] [phone]   Clinic Follow up Note   Patient Care Team: Burnard Bunting, MD as PCP - General (Internal Medicine) Gery Pray, MD as Consulting Physician (Radiation Oncology) Truitt Merle, MD as Consulting Physician (Hematology) Stark Klein, MD as Consulting Physician (General Surgery)   I connected with [name] on 09/24/2019 at 10:00 AM EDT by telephone visit and verified that I am speaking with the correct person using two identifiers.  I discussed the limitations, risks, security and privacy concerns of performing an evaluation and management service by telephone and the availability of in person appointments. I also discussed with the patient that there may be a patient responsible charge related to this service. The patient expressed understanding and agreed to proceed.   Other persons participating in the visit and their role in the encounter:  Her granddaughter   Patient's location:  Her home  Provider's location:  My Office   CHIEF COMPLAINT:  F/u on left breast cancer  SUMMARY OF ONCOLOGIC HISTORY: Oncology History Overview Note  Cancer Staging Malignant neoplasm of lower-inner quadrant of left breast in female, estrogen receptor positive ([place]) Staging form: Breast, AJCC 8th Edition - Clinical stage from 01/16/2018: Stage IA (cT1b, cN0, cM0, G1, ER+, PR+, HER2-) - Signed by Truitt Merle, MD on 01/25/2018 - Pathologic stage from 02/21/2018: Stage IA (pT1c, pN0, cM0, G1, ER+, PR+, HER2-, Oncotype DX score: 18) - Signed by Truitt Merle, MD on 04/20/2018     Malignant neoplasm of lower-inner quadrant of left breast in female, estrogen receptor positive ([place])  01/16/2018 Mammogram   Diagnostic Mammogram of left breast 01/16/18 IMPRESSION: 1.  There is a suspicious mass measuring 7x6x7 mm in the left breast at 8 o'clock position 1 cm from the nipple. 2.  No evidence of left axillary lymphadenopathy.  RECOMMENDATION: Ultrasound guided biopsy is recommended for the left breast mass.    01/16/2018 Initial Biopsy   Diagnosis 01/16/18 Breast, left, needle core biopsy, 8 o'clock - INVASIVE DUCTAL CARCINOMA. - DUCTAL CARCINOMA IN SITU.    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 T AIP,ALK, APC, ATM, AXIN2,BAP1,  BARD1, BLM, BMPR1A, BRCA1, BRCA2, BRIP1, BUB1B, CASR, CDC73, CDH1, CDK4, CDKN1B, CDKN1C, CDKN2A (p14ARF), CDKN2A (p16INK4a), CEBPA, CEP57, CHEK2, CTNNA1, DICER1, DIS3L2, EGFR (c.2369C>T, p.Thr790Met variant only), ENG, EPCAM (Deletion/duplication testing only), FH, FLCN, GATA2, GALNT12, GPC3, GREM1 (Promoter region deletion/duplication testing only), HOXB13 (c.251G>A, p.Gly84Glu), HRAS, KIT, MAX, MEN1, MET, MITF (c.952G>A, p.Glu318Lys variant only), MLH1, MSH2, MSH3, MSH6, MUTYH, NBN, NF1, NF2, NTHL1, PALB2, PDGFRA, PHOX2B, PMS2, POLD1, POLE, POT1, PRKAR1A, PTCH1, PTEN, RAD50, RAD51C, RAD51D, RB1, RECQL4, RET, RUNX1, RSP20, RNF43, SDHAF2, SDHA (sequence changes only),SDHAF2, SDHB, SDHC, SDHD, SMAD4, SMARCA4, SMARCB1, SMARCE1, STK11, SUFU, TERC, TERT, TMEM127, TP53, TSC1, TSC2, VHL, WRN and WT1.   Results: No pathogenic variants identified.  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  The date of this test report is 02/09/2018.     02/21/2018 Surgery   She had right breast lumpectomy with Dr. Barry Dienes on 02/21/2018.   02/21/2018 Pathology Results   02/21/2018 Surgical Pathology Diagnosis 1. Breast, lumpectomy, Left - INVASIVE AND IN SITU DUCTAL CARCINOMA, 1.2 CM, GRADE I. - MARGINS NOT INVOLVED. - PREVIOUS BIOPSY SITE AND BIOPSY CLIP. - FIBROCYSTIC CHANGES WITH USUAL DUCTAL HYPERPLASIA. 2. Lymph node, sentinel, biopsy, Left Axillary #1 - ONE BENIGN LYMPH NODE (0/1). 3. Lymph node, sentinel, biopsy, Left Axillary #2 - ONE BENIGN LYMPH NODE (0/1).   02/21/2018 Oncotype testing   Oncotype:  Recurrence score 18  Distant recurrence risk at 9 years with Tamoxifen or AI alone is 5% Less than 1% benefit from adjuvant chemotherapy    02/21/2018 Cancer Staging   Staging form: Breast, AJCC 8th Edition - Pathologic stage from 02/21/2018: Stage IA (pT1c, pN0, cM0, G1, ER+, PR+, HER2-, Oncotype DX score: 18) - Signed by Truitt Merle, MD on 04/20/2018   02/21/2018 Mammogram   02/21/2018 Mammogram FINDINGS: Status post excision of the left breast. The wire tip and biopsy marker clip are present and are marked for pathology. The locations of the radioactive seed and biopsy marker clip within the specimen were discussed with the OR staff during the procedure.  IMPRESSION: Specimen radiograph of the left breast.   03/27/2018 - 04/21/2018 Radiation Therapy   she started daily radiation therapy with Dr. Sondra Come on 03/27/2018-04/21/18    07/12/2018 Imaging   07/12/2018 DEXA ASSESSMENT: The BMD measured at Forearm Radius 33% is 0.745 g/cm2 with a T-score of -1.6. This patient is considered osteopenic according to [facility] criteria.   07/2018 -  Anti-estrogen oral therapy   Adjuvant anastrozole, started 07/2018. Due to wroseneing fatigue she switched to Exemestane in 03/2019.       CURRENT THERAPY:  Adjuvant  anastrozole, started 07/2018. Due to worsening fatigue she switched to Exemestane in 03/2019.   INTERVAL HISTORY:  Alishia Lebo is here for a follow up of left breast cancer. She presents to the clinic alone. They identified themselves by birth date. She notes she is doing well. She notes being fatigued on Exemestane. She notes this is better than anastrozole. She is still able to take care of her granddaughter. She will take naps as needed. She notes her next Colonoscopy with Dr. Thana Farr recently and is to repeat every 3 years due to polyps. She will repeat in April or May.    REVIEW OF SYSTEMS:   Constitutional: Denies fevers, chills or abnormal weight loss (+) fatigue  Eyes: Denies blurriness of vision Ears, nose, mouth, throat, and face: Denies mucositis or sore throat Respiratory: Denies cough, dyspnea or wheezes Cardiovascular: Denies palpitation, chest discomfort or lower extremity swelling Gastrointestinal:  Denies nausea, heartburn or change in bowel habits Skin: Denies abnormal skin rashes Lymphatics: Denies new lymphadenopathy or easy bruising Neurological:Denies numbness, tingling or new weaknesses Behavioral/Psych: Mood is stable, no new changes  All other systems were reviewed with the patient and are negative.  MEDICAL HISTORY:  Past Medical History:  Diagnosis Date  . Anxiety   . Arthritis   . Depression   . Family history of breast cancer   . Family history of colon cancer   . Family history of leukemia   . Hypertension     SURGICAL HISTORY: Past Surgical History:  Procedure Laterality Date  . BACK SURGERY    .  BREAST BIOPSY Left 03/10/2010   benign  . BREAST LUMPECTOMY WITH RADIOACTIVE SEED AND SENTINEL LYMPH NODE BIOPSY Left 02/21/2018   Procedure: BREAST LUMPECTOMY WITH RADIOACTIVE SEED AND SENTINEL LYMPH NODE BIOPSY;  Surgeon: Stark Klein, MD;  Location: [facility];  Service: General;  Laterality: Left;  . CHOLECYSTECTOMY    . HYSTERECTOMY ABDOMINAL WITH  SALPINGECTOMY    . NECK SURGERY    . TONSILLECTOMY      I have reviewed the social history and family history with the patient and they are unchanged from previous note.  ALLERGIES:  has No Known Allergies.  MEDICATIONS:  Current Outpatient Medications  Medication Sig Dispense Refill  . ARIPiprazole (ABILIFY) 5 MG tablet Take 5 mg by mouth daily. In the morning.    . Ascorbic Acid (VITAMIN C) 1000 MG tablet Take 1,000 mg by mouth daily.    . carvedilol (COREG) 12.5 MG tablet Take 12.5 mg by mouth daily.     . DULoxetine (CYMBALTA) 60 MG capsule Take 60 mg by mouth daily.    [place] Kitchen exemestane (AROMASIN) 25 MG tablet Take 1 tablet (25 mg total) by mouth daily after breakfast. 30 tablet 11  . ibuprofen (ADVIL,MOTRIN) 200 MG tablet Take 400 mg by mouth every 8 (eight) hours as needed for mild pain (for pain .).     Mar[place] Kitchen loratadine (CLARITIN) 10 MG tablet Take 10 mg by mouth daily.    Mar[place] Kitchen LORazepam (ATIVAN) 1 MG tablet Take 1 mg by mouth every 8 (eight) hours as needed for anxiety.     . meloxicam (MOBIC) 7.5 MG tablet Take 15 mg by mouth daily.  4  . olmesartan-hydrochlorothiazide (BENICAR HCT) 40-25 MG tablet Take 1 tablet by mouth at bedtime.     [place] Kitchen omeprazole (PRILOSEC) 40 MG capsule Take 40 mg by mouth daily.    [place] Kitchen oxyCODONE (OXY IR/ROXICODONE) 5 MG immediate release tablet Take 0.5-1 tablets (2.5-5 mg total) by mouth every 4 (four) hours as needed for severe pain. 10 tablet 0  . potassium chloride SA (K-DUR) 20 MEQ tablet Take 1 tablet (20 mEq total) by mouth daily. 7 tablet 0  . Probiotic Product (PROBIOTIC PO) Take 1 capsule by mouth daily.    . traZODone (DESYREL) 50 MG tablet Take 50 mg by mouth at bedtime.     No current facility-administered medications for this visit.    PHYSICAL EXAMINATION: ECOG PERFORMANCE STATUS: 1 - Symptomatic but completely ambulatory  No vitals taken today, Exam not performed today   LABORATORY DATA:  I have reviewed the data as listed CBC Latest Ref Rng &  Units 05/25/2019 01/12/2019 07/27/2018  WBC 4.0 - 10.5 K/uL 10.1 8.0 7.9  Hemoglobin 12.0 - 15.0 g/dL 13.4 11.9(L) 12.7  Hematocrit 36.0 - 46.0 % 40.6 36.8 38.4  Platelets 150 - 400 K/uL 261 252 210     CMP Latest Ref Rng & Units 05/25/2019 01/12/2019 07/27/2018  Glucose 70 - 99 mg/dL 101(H) 92 97  BUN 8 - 23 mg/dL '13 16 17  ' Creatinine 0.44 - 1.00 mg/dL 0.87 0.89 0.94  Sodium 135 - 145 mmol/L 131(L) 133(L) 133(L)  Potassium 3.5 - 5.1 mmol/L 3.4(L) 3.3(L) 3.8  Chloride 98 - 111 mmol/L 92(L) 95(L) 93(L)  CO2 22 - 32 mmol/L 28 27 32  Calcium 8.9 - 10.3 mg/dL 9.2 8.8(L) 9.4  Total Protein 6.5 - 8.1 g/dL 6.7 6.3(L) 6.4(L)  Total Bilirubin 0.3 - 1.2 mg/dL 0.4 0.4 0.4  Alkaline Phos 38 - 126 U/L 80 81 87  AST 15 - 41 U/L '16 20 15  ' ALT 0 - 44 U/L '13 14 15      ' RADIOGRAPHIC STUDIES: I have personally reviewed the radiological images as listed and agreed with the findings in the report. No results found.   ASSESSMENT & PLAN:  Pernie Grosso is a 74 y.o. female with    1. 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 Treated withleftlumpectomy and radiation. -She has started adjuvant anastrozolein 07/2018. Due to worsening fatigue she switched to Exemestane in 03/2019, tolerating very well. Continue for at least 5 years.  -She is clinically doing well. Her 01/2019 mammogram was unremarkable. There is no clinical concern for recurrence. -She is tolerating Exemestane with mild fatigue which is better than on Anastrozole. I encouraged her to be active and take naps as needed.  -Continue surveillance. Next mammogram in 01/2020  -Continue Exemestane  -F/u in 4 months    2.Microcytic anemia  -Per patient she had anemia when young and use to take oral iron.  -Her last colonoscopy had several polyps. She did have GI bleeding in the past. She does havehemorrhoidsand was  offered banding.  -She plans to havecolonoscopyin early 2021.  -anemia and fatigue improved with oral iron, will continue.    3.Osteopenia ofleftarm -DEXA on 07/2018 revealed T score of -1.6ofleftforearmradius. Will repeat every 2 years to monitor on AI.  -Her estimated risk of fracture is not very high, no need forbisphosphonatefor now -She will continue oralcalcium and vitamin D. -Next DEXA in 07/2020   Plan -She is clinically doing well  -Continue Exemestane  -Mammogram in 01/2020  -Lab and F/u in 4 months, which is scheduled     No problem-specific Assessment & Plan notes found for this encounter.   Orders Placed This Encounter  Procedures  . MM DIAG BREAST TOMO BILATERAL    Standing Status:   Future    Standing Expiration Date:   09/23/2020    Order Specific Question:   Reason for Exam (SYMPTOM  OR DIAGNOSIS REQUIRED)    Answer:   screening    Order Specific Question:   Preferred imaging location?    Answer:   [facility]   I discussed the assessment and treatment plan with the patient. The patient was provided an opportunity to ask questions and all were answered. The patient agreed with the plan and demonstrated an understanding of the instructions.  The patient was advised to call back or seek an in-person evaluation if the symptoms worsen or if the condition fails to improve as anticipated.  The total time spent in the appointment was 15 minutes.    Truitt Merle, MD 09/24/2019   I, Joslyn Devon, am acting as scribe for Truitt Merle, MD.   I have reviewed the above documentation for accuracy and completeness, and I agree with the above.

## 2019-09-24 ENCOUNTER — Encounter: Payer: Self-pay | Admitting: Hematology

## 2019-09-24 ENCOUNTER — Inpatient Hospital Stay: Payer: Medicare (Managed Care) | Attending: Hematology | Admitting: Hematology

## 2019-09-24 DIAGNOSIS — C50312 Malignant neoplasm of lower-inner quadrant of left female breast: Secondary | ICD-10-CM | POA: Diagnosis not present

## 2019-09-24 DIAGNOSIS — Z170 Estrogen receptor positive status [ER+]: Secondary | ICD-10-CM | POA: Diagnosis not present

## 2019-11-09 ENCOUNTER — Other Ambulatory Visit: Payer: Self-pay | Admitting: Orthopedic Surgery

## 2019-11-09 DIAGNOSIS — M25561 Pain in right knee: Secondary | ICD-10-CM

## 2019-11-25 ENCOUNTER — Ambulatory Visit
Admission: RE | Admit: 2019-11-25 | Discharge: 2019-11-25 | Disposition: A | Discharge status: Home / Self Care | Payer: Medicare Other | Source: Ambulatory Visit | Attending: Orthopedic Surgery | Admitting: Orthopedic Surgery

## 2019-11-25 ENCOUNTER — Other Ambulatory Visit: Payer: Self-pay

## 2019-11-25 DIAGNOSIS — M25561 Pain in right knee: Secondary | ICD-10-CM

## 2020-01-21 ENCOUNTER — Inpatient Hospital Stay: Payer: Medicare Other | Attending: Hematology

## 2020-01-21 ENCOUNTER — Inpatient Hospital Stay: Payer: Medicare Other | Admitting: Hematology

## 2020-01-31 ENCOUNTER — Other Ambulatory Visit: Payer: Self-pay

## 2020-01-31 ENCOUNTER — Ambulatory Visit
Admission: RE | Admit: 2020-01-31 | Discharge: 2020-01-31 | Disposition: A | Discharge status: Home / Self Care | Payer: Medicare Other | Source: Ambulatory Visit | Attending: Hematology | Admitting: Hematology

## 2020-01-31 DIAGNOSIS — C50312 Malignant neoplasm of lower-inner quadrant of left female breast: Secondary | ICD-10-CM

## 2020-01-31 HISTORY — DX: Personal history of irradiation: Z92.3

## 2020-03-17 ENCOUNTER — Telehealth: Payer: Self-pay | Admitting: Hematology

## 2020-03-17 NOTE — Telephone Encounter (Signed)
Scheduled appt per 9/13 sch msg - unable to reach pt . Left message for patient with appt date and time

## 2020-03-19 ENCOUNTER — Other Ambulatory Visit: Payer: Self-pay | Admitting: General Surgery

## 2020-03-19 DIAGNOSIS — Z800 Family history of malignant neoplasm of digestive organs: Secondary | ICD-10-CM

## 2020-04-10 ENCOUNTER — Ambulatory Visit
Admission: RE | Admit: 2020-04-10 | Discharge: 2020-04-10 | Disposition: A | Discharge status: Home / Self Care | Payer: Medicare Other | Source: Ambulatory Visit | Attending: General Surgery | Admitting: General Surgery

## 2020-04-10 DIAGNOSIS — Z800 Family history of malignant neoplasm of digestive organs: Secondary | ICD-10-CM

## 2020-04-10 MED ORDER — GADOBENATE DIMEGLUMINE 529 MG/ML IV SOLN
15.0000 mL | Freq: Once | INTRAVENOUS | Status: AC | PRN
  Administered 2020-04-10: 15 mL via INTRAVENOUS

## 2020-04-23 ENCOUNTER — Other Ambulatory Visit: Payer: Self-pay

## 2020-04-23 DIAGNOSIS — C50312 Malignant neoplasm of lower-inner quadrant of left female breast: Secondary | ICD-10-CM

## 2020-06-30 ENCOUNTER — Other Ambulatory Visit: Payer: Self-pay | Admitting: Hematology

## 2020-07-25 ENCOUNTER — Ambulatory Visit (HOSPITAL_COMMUNITY)
Admission: RE | Admit: 2020-07-25 | Discharge: 2020-07-25 | Disposition: A | Discharge status: Home / Self Care | Payer: Medicare Other | Source: Ambulatory Visit | Attending: Hematology | Admitting: Hematology

## 2020-07-25 ENCOUNTER — Other Ambulatory Visit: Payer: Self-pay

## 2020-07-25 ENCOUNTER — Encounter (HOSPITAL_COMMUNITY): Payer: Self-pay

## 2020-07-25 ENCOUNTER — Inpatient Hospital Stay: Payer: Medicare Other | Attending: Hematology

## 2020-07-25 DIAGNOSIS — C50312 Malignant neoplasm of lower-inner quadrant of left female breast: Secondary | ICD-10-CM

## 2020-07-25 DIAGNOSIS — Z170 Estrogen receptor positive status [ER+]: Secondary | ICD-10-CM | POA: Diagnosis present

## 2020-07-25 LAB — CBC WITH DIFFERENTIAL (CANCER CENTER ONLY)
Abs Immature Granulocytes: 0.07 10*3/uL (ref 0.00–0.07)
Basophils Absolute: 0 10*3/uL (ref 0.0–0.1)
Basophils Relative: 1 %
Eosinophils Absolute: 0.1 10*3/uL (ref 0.0–0.5)
Eosinophils Relative: 2 %
HCT: 40 % (ref 36.0–46.0)
Hemoglobin: 13.4 g/dL (ref 12.0–15.0)
Immature Granulocytes: 1 %
Lymphocytes Relative: 20 %
Lymphs Abs: 1.6 10*3/uL (ref 0.7–4.0)
MCH: 28.1 pg (ref 26.0–34.0)
MCHC: 33.5 g/dL (ref 30.0–36.0)
MCV: 83.9 fL (ref 80.0–100.0)
Monocytes Absolute: 0.7 10*3/uL (ref 0.1–1.0)
Monocytes Relative: 9 %
Neutro Abs: 5.3 10*3/uL (ref 1.7–7.7)
Neutrophils Relative %: 67 %
Platelet Count: 220 10*3/uL (ref 150–400)
RBC: 4.77 MIL/uL (ref 3.87–5.11)
RDW: 13.5 % (ref 11.5–15.5)
WBC Count: 7.8 10*3/uL (ref 4.0–10.5)
nRBC: 0 % (ref 0.0–0.2)

## 2020-07-25 LAB — CMP (CANCER CENTER ONLY)
ALT: 11 U/L (ref 0–44)
AST: 13 U/L — ABNORMAL LOW (ref 15–41)
Albumin: 3.5 g/dL (ref 3.5–5.0)
Alkaline Phosphatase: 65 U/L (ref 38–126)
Anion gap: 11 (ref 5–15)
BUN: 12 mg/dL (ref 8–23)
CO2: 29 mmol/L (ref 22–32)
Calcium: 9.1 mg/dL (ref 8.9–10.3)
Chloride: 97 mmol/L — ABNORMAL LOW (ref 98–111)
Creatinine: 0.83 mg/dL (ref 0.44–1.00)
GFR, Estimated: >60 mL/min (ref >60)
Glucose, Bld: 101 mg/dL — ABNORMAL HIGH (ref 70–99)
Potassium: 3.8 mmol/L (ref 3.5–5.1)
Sodium: 137 mmol/L (ref 135–145)
Total Bilirubin: 0.4 mg/dL (ref 0.3–1.2)
Total Protein: 5.8 g/dL — ABNORMAL LOW (ref 6.5–8.1)

## 2020-07-25 MED ORDER — IOHEXOL 300 MG/ML  SOLN
100.0000 mL | Freq: Once | INTRAMUSCULAR | Status: AC | PRN
  Administered 2020-07-25: 100 mL via INTRAVENOUS

## 2020-07-28 ENCOUNTER — Inpatient Hospital Stay (HOSPITAL_BASED_OUTPATIENT_CLINIC_OR_DEPARTMENT_OTHER): Payer: Medicare Other | Admitting: Hematology

## 2020-07-28 DIAGNOSIS — C50312 Malignant neoplasm of lower-inner quadrant of left female breast: Secondary | ICD-10-CM

## 2020-07-28 DIAGNOSIS — Z170 Estrogen receptor positive status [ER+]: Secondary | ICD-10-CM

## 2020-07-28 NOTE — Progress Notes (Signed)
[facility]   [phone] [phone]) [phone]   Clinic Follow up Note   Patient Care Team: Burnard Bunting, MD as PCP - General (Internal Medicine) Gery Pray, MD as Consulting Physician (Radiation Oncology) Truitt Merle, MD as Consulting Physician (Hematology) Stark Klein, MD as Consulting Physician (General Surgery)   I connected with [name] on 07/28/2020 at  3:45 PM EST by video enabled telemedicine visit and verified that I am speaking with the correct person using two identifiers.  I discussed the limitations, risks, security and privacy concerns of performing an evaluation and management service by telephone and the availability of in person appointments. I also discussed with the patient that there may be a patient responsible charge related to this service. The patient expressed understanding and agreed to proceed.   Other persons participating in the visit and their role in the encounter:  None   Patient's location:  Her home  Provider's location:  My office   CHIEF COMPLAINT: F/u on left breast cancer  SUMMARY OF ONCOLOGIC HISTORY: Oncology History Overview Note  Cancer Staging Malignant neoplasm of lower-inner quadrant of left breast in female, estrogen receptor positive ([place]) Staging form: Breast, AJCC 8th Edition - Clinical stage from 01/16/2018: Stage IA (cT1b, cN0, cM0, G1, ER+, PR+, HER2-) - Signed by Truitt Merle, MD on 01/25/2018 - Pathologic stage from 02/21/2018: Stage IA (pT1c, pN0, cM0, G1, ER+, PR+, HER2-, Oncotype DX score: 18) - Signed by Truitt Merle, MD on 04/20/2018     Malignant neoplasm of lower-inner quadrant of left breast in female, estrogen receptor positive ([place])  01/16/2018 Mammogram   Diagnostic Mammogram of left breast 01/16/18 IMPRESSION: 1.  There is a suspicious mass measuring 7x6x7 mm in the left breast at 8 o'clock position 1 cm from the nipple. 2.  No evidence of left axillary  lymphadenopathy. RECOMMENDATION: Ultrasound guided biopsy is recommended for the left breast mass.    01/16/2018 Initial Biopsy   Diagnosis 01/16/18 Breast, left, needle core biopsy, 8 o'clock - INVASIVE DUCTAL CARCINOMA. - DUCTAL CARCINOMA IN SITU.    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 T AIP,ALK, APC, ATM, AXIN2,BAP1,  BARD1, BLM, BMPR1A, BRCA1, BRCA2, BRIP1, BUB1B, CASR, CDC73, CDH1, CDK4, CDKN1B, CDKN1C, CDKN2A (p14ARF), CDKN2A (p16INK4a), CEBPA, CEP57, CHEK2, CTNNA1, DICER1, DIS3L2, EGFR (c.2369C>T, p.Thr790Met variant only), ENG, EPCAM (Deletion/duplication testing only), FH, FLCN, GATA2, GALNT12, GPC3, GREM1 (Promoter region deletion/duplication testing only), HOXB13 (c.251G>A, p.Gly84Glu), HRAS, KIT, MAX, MEN1, MET, MITF (c.952G>A, p.Glu318Lys variant only), MLH1, MSH2, MSH3, MSH6, MUTYH, NBN, NF1, NF2, NTHL1, PALB2, PDGFRA, PHOX2B, PMS2, POLD1, POLE, POT1, PRKAR1A, PTCH1, PTEN, RAD50, RAD51C, RAD51D, RB1, RECQL4, RET, RUNX1, RSP20, RNF43, SDHAF2, SDHA (sequence changes only),SDHAF2, SDHB, SDHC, SDHD, SMAD4, SMARCA4, SMARCB1, SMARCE1, STK11, SUFU, TERC, TERT, TMEM127, TP53, TSC1, TSC2, VHL, WRN and WT1.   Results: No pathogenic variants identified.  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  The date of this test report is 02/09/2018.     02/21/2018 Surgery   She had right breast lumpectomy with Dr. Barry Dienes on 02/21/2018.   02/21/2018 Pathology Results   02/21/2018 Surgical Pathology Diagnosis 1. Breast, lumpectomy, Left - INVASIVE AND IN SITU DUCTAL CARCINOMA, 1.2 CM, GRADE I. - MARGINS NOT INVOLVED. - PREVIOUS BIOPSY SITE AND BIOPSY CLIP. - FIBROCYSTIC CHANGES WITH USUAL DUCTAL HYPERPLASIA. 2. Lymph node, sentinel, biopsy, Left Axillary #1 - ONE BENIGN LYMPH NODE (0/1). 3. Lymph node, sentinel, biopsy, Left Axillary #2 - ONE BENIGN LYMPH NODE (0/1).   02/21/2018 Oncotype testing   Oncotype:  Recurrence score 18  Distant recurrence risk at 9 years with Tamoxifen or AI alone is 5% Less than 1% benefit from adjuvant chemotherapy    02/21/2018 Cancer Staging   Staging form: Breast, AJCC 8th Edition - Pathologic stage from 02/21/2018: Stage IA (pT1c, pN0, cM0, G1, ER+, PR+, HER2-, Oncotype DX score: 18) - Signed by Truitt Merle, MD on 04/20/2018   02/21/2018 Mammogram   02/21/2018 Mammogram FINDINGS: Status post excision of the left breast. The wire tip and biopsy marker clip are present and are marked for pathology. The locations of the radioactive seed and biopsy marker clip within the specimen were discussed with the OR staff during the procedure.  IMPRESSION: Specimen radiograph of the left breast.   03/27/2018 - 04/21/2018 Radiation Therapy   she started daily radiation therapy with Dr. Sondra Come on 03/27/2018-04/21/18    07/12/2018 Imaging   07/12/2018 DEXA ASSESSMENT: The BMD measured at Forearm Radius 33% is 0.745 g/cm2 with a T-score of -1.6. This patient is considered osteopenic according to [facility]) criteria.   07/2018 -  Anti-estrogen oral therapy   Adjuvant anastrozole, started 07/2018. Due to wroseneing fatigue she switched to Exemestane in 03/2019.       CURRENT  THERAPY:  Adjuvant anastrozole, started 07/2018. Due to worsening fatigue she switched to Exemestane in 03/2019.  INTERVAL HISTORY:  [name] is here for a follow up of left breast cancer. She was last seen by me in 09/2019 They identified themselves by face to face video. She notes she is doing well. She notes she is tolerating Exemestane well. She notes fatigue but this is manageable.    REVIEW OF SYSTEMS:   Constitutional: Denies fevers, chills or abnormal weight loss (+) Fatigue  Eyes: Denies blurriness of vision Ears, nose, mouth, throat, and face: Denies mucositis or sore throat Respiratory: Denies cough, dyspnea or wheezes Cardiovascular: Denies palpitation, chest discomfort or lower extremity swelling Gastrointestinal:  Denies nausea, heartburn or change in bowel habits Skin: Denies abnormal skin rashes Lymphatics: Denies new lymphadenopathy or easy bruising Neurological:Denies numbness, tingling or new weaknesses Behavioral/Psych: Mood is stable, no new changes  All other systems were reviewed with the patient and are negative.  MEDICAL HISTORY:  Past Medical History:  Diagnosis Date  . Anxiety   . Arthritis   . Depression   . Family history of breast cancer   . Family history of colon cancer   . Family history of leukemia   . Hypertension   . Personal history of radiation therapy     SURGICAL HISTORY: Past Surgical History:  Procedure Laterality Date  . BACK SURGERY    . BREAST BIOPSY Left 03/10/2010   benign  . BREAST BIOPSY Left 01/16/2018  . BREAST LUMPECTOMY Left 02/21/2018  . BREAST LUMPECTOMY WITH RADIOACTIVE  SEED AND SENTINEL LYMPH NODE BIOPSY Left 02/21/2018   Procedure: BREAST LUMPECTOMY WITH RADIOACTIVE SEED AND SENTINEL LYMPH NODE BIOPSY;  Surgeon: Stark Klein, MD;  Location: [facility];  Service: General;  Laterality: Left;  . CHOLECYSTECTOMY    . HYSTERECTOMY ABDOMINAL WITH SALPINGECTOMY    . NECK SURGERY    . TONSILLECTOMY      I have  reviewed the social history and family history with the patient and they are unchanged from previous note.  ALLERGIES:  has No Known Allergies.  MEDICATIONS:  Current Outpatient Medications  Medication Sig Dispense Refill  . ARIPiprazole (ABILIFY) 5 MG tablet Take 5 mg by mouth daily. In the morning.    . Ascorbic Acid (VITAMIN C) 1000 MG tablet Take 1,000 mg by mouth daily.    . carvedilol (COREG) 12.5 MG tablet Take 12.5 mg by mouth daily.     . DULoxetine (CYMBALTA) 60 MG capsule Take 60 mg by mouth daily.    Marland Kitchen exemestane (AROMASIN) 25 MG tablet TAKE 1 TABLET (25 MG TOTAL) BY MOUTH DAILY AFTER BREAKFAST. 90 tablet 3  . ibuprofen (ADVIL,MOTRIN) 200 MG tablet Take 400 mg by mouth every 8 (eight) hours as needed for mild pain (for pain .).     Marland Kitchen loratadine (CLARITIN) 10 MG tablet Take 10 mg by mouth daily.    Marland Kitchen LORazepam (ATIVAN) 1 MG tablet Take 1 mg by mouth every 8 (eight) hours as needed for anxiety.     . meloxicam (MOBIC) 7.5 MG tablet Take 15 mg by mouth daily.  4  . olmesartan-hydrochlorothiazide (BENICAR HCT) 40-25 MG tablet Take 1 tablet by mouth at bedtime.     Marland Kitchen omeprazole (PRILOSEC) 40 MG capsule Take 40 mg by mouth daily.    Marland Kitchen oxyCODONE (OXY IR/ROXICODONE) 5 MG immediate release tablet Take 0.5-1 tablets (2.5-5 mg total) by mouth every 4 (four) hours as needed for severe pain. 10 tablet 0  . potassium chloride SA (K-DUR) 20 MEQ tablet Take 1 tablet (20 mEq total) by mouth daily. 7 tablet 0  . Probiotic Product (PROBIOTIC PO) Take 1 capsule by mouth daily.    . traZODone (DESYREL) 50 MG tablet Take 50 mg by mouth at bedtime.     No current facility-administered medications for this visit.    PHYSICAL EXAMINATION: ECOG PERFORMANCE STATUS: 1  No vitals taken today, Exam not performed today   LABORATORY DATA:  I have reviewed the data as listed CBC Latest Ref Rng & Units 07/25/2020 05/25/2019 01/12/2019  WBC 4.0 - 10.5 K/uL 7.8 10.1 8.0  Hemoglobin 12.0 - 15.0 g/dL 13.4  13.4 11.9(L)  Hematocrit 36.0 - 46.0 % 40.0 40.6 36.8  Platelets 150 - 400 K/uL 220 261 252     CMP Latest Ref Rng & Units 07/25/2020 05/25/2019 01/12/2019  Glucose 70 - 99 mg/dL 101(H) 101(H) 92  BUN 8 - 23 mg/dL _0 Creatinine 0.44 - 1.00 mg/dL 0.83 0.87 0.89  Sodium 135 - 145 mmol/L 137 131(L) 133(L)  Potassium 3.5 - 5.1 mmol/L 3.8 3.4(L) 3.3(L)  Chloride 98 - 111 mmol/L 97(L) 92(L) 95(L)  CO2 22 - 32 mmol/L _1 Calcium 8.9 - 10.3 mg/dL 9.1 9.2 8.8(L)  Total Protein 6.5 - 8.1 g/dL 5.8(L) 6.7 6.3(L)  Total Bilirubin 0.3 - 1.2 mg/dL 0.4 0.4 0.4  Alkaline Phos 38 - 126 U/L 65 80 81  AST 15 - 41 U/L 13(L) 16 20  ALT 0 - 44 U/L 11 13 14  RADIOGRAPHIC STUDIES: I have personally reviewed the radiological images as listed and agreed with the findings in the report. No results found.   ASSESSMENT & PLAN:  Analeia Ismael is a 75 y.o. female with    1. 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 Treated withleftlumpectomy and radiation. -She has started adjuvant anastrozolein 07/2018. Due to worsening fatigue she switched to Exemestane in 03/2019,tolerating very well. Continue for at least 5 years.  -She is clinically doing well. She has fatigue from Exemestane. I encouraged her to remain active. She overall has no concerns.  -Continue surveillance. Next mammogram in 01/2021  -Continue Exemestane  -F/u in 5 months    2.Microcytic anemia  -Per patient she had anemia when young and use to take oral iron.  -Her last colonoscopy had several polyps. She did have GI bleeding in the past. She does havehemorrhoidsand was offered banding.  -She plans to havecolonoscopyinearly2021.  -Resolved on 07/25/20 labs.    3.Osteopenia ofleftarm -DEXA on 07/2018 revealed T score of -1.6ofleftforearmradius. Will repeat every 2 years to monitor  on AI. -Her estimated risk of fracture is not very high, no need forbisphosphonatefor now -She will continue oralcalcium and vitamin D. -Next DEXA in 07/2020  4. Lymphadenopathy  -Given swelling of her stomach in late 2021, she had work up with Dr Barry Dienes. Her MRI abdomen in 04/2020 showed Nonspecific mild left para-aortic lymphadenopathy. Metastatic disease cannot be excluded.  -Her repeat imagine with CT AP on 07/25/20 showed Stable appearance of borderline retroperitoneal lymph nodes. No new or progressive adenopathy identified. I reviewed with patient today.  -Plan to scan her once more in 05/2021. She is agreeable.    Plan -CT AP reviewed.  -Continue Exemestane  -Mammogram in 01/2021 -Lab and f/u in 5 months, will order CT scan on next visit    No problem-specific Assessment & Plan notes found for this encounter.   No orders of the defined types were placed in this encounter.  I discussed the assessment and treatment plan with the patient. The patient was provided an opportunity to ask questions and all were answered. The patient agreed with the plan and demonstrated an understanding of the instructions.  The patient was advised to call back or seek an in-person evaluation if the symptoms worsen or if the condition fails to improve as anticipated.  The total time spent in the appointment was 30 minutes.    Truitt Merle, MD 07/28/2020   I, Joslyn Devon, am acting as scribe for Truitt Merle, MD.   I have reviewed the above documentation for accuracy and completeness, and I agree with the above.

## 2020-07-29 ENCOUNTER — Encounter: Payer: Self-pay | Admitting: Hematology

## 2020-09-15 ENCOUNTER — Ambulatory Visit: Payer: Medicare Other | Admitting: Hematology

## 2020-09-15 ENCOUNTER — Other Ambulatory Visit: Payer: Medicare Other

## 2020-09-23 ENCOUNTER — Emergency Department (HOSPITAL_COMMUNITY)
Admission: EM | Admit: 2020-09-23 | Discharge: 2020-09-24 | Disposition: A | Discharge status: Home / Self Care | Payer: Medicare Other | Attending: Emergency Medicine | Admitting: Emergency Medicine

## 2020-09-23 ENCOUNTER — Other Ambulatory Visit: Payer: Self-pay

## 2020-09-23 ENCOUNTER — Ambulatory Visit
Admission: EM | Admit: 2020-09-23 | Discharge: 2020-09-23 | Disposition: A | Discharge status: Home / Self Care | Payer: Medicare Other | Attending: Emergency Medicine | Admitting: Emergency Medicine

## 2020-09-23 ENCOUNTER — Encounter: Payer: Self-pay | Admitting: Emergency Medicine

## 2020-09-23 ENCOUNTER — Encounter (HOSPITAL_COMMUNITY): Payer: Self-pay

## 2020-09-23 ENCOUNTER — Emergency Department (HOSPITAL_COMMUNITY): Payer: Medicare Other

## 2020-09-23 DIAGNOSIS — S22081A Stable burst fracture of T11-T12 vertebra, initial encounter for closed fracture: Secondary | ICD-10-CM

## 2020-09-23 DIAGNOSIS — Z79899 Other long term (current) drug therapy: Secondary | ICD-10-CM | POA: Diagnosis not present

## 2020-09-23 DIAGNOSIS — S3991XA Unspecified injury of abdomen, initial encounter: Secondary | ICD-10-CM | POA: Insufficient documentation

## 2020-09-23 DIAGNOSIS — S60212A Contusion of left wrist, initial encounter: Secondary | ICD-10-CM | POA: Diagnosis not present

## 2020-09-23 DIAGNOSIS — S8011XA Contusion of right lower leg, initial encounter: Secondary | ICD-10-CM | POA: Diagnosis not present

## 2020-09-23 DIAGNOSIS — S8012XA Contusion of left lower leg, initial encounter: Secondary | ICD-10-CM | POA: Insufficient documentation

## 2020-09-23 DIAGNOSIS — T1490XA Injury, unspecified, initial encounter: Secondary | ICD-10-CM | POA: Diagnosis present

## 2020-09-23 DIAGNOSIS — S22051A Stable burst fracture of T5-T6 vertebra, initial encounter for closed fracture: Secondary | ICD-10-CM

## 2020-09-23 DIAGNOSIS — I10 Essential (primary) hypertension: Secondary | ICD-10-CM | POA: Insufficient documentation

## 2020-09-23 DIAGNOSIS — M545 Low back pain, unspecified: Secondary | ICD-10-CM | POA: Diagnosis not present

## 2020-09-23 DIAGNOSIS — R072 Precordial pain: Secondary | ICD-10-CM | POA: Insufficient documentation

## 2020-09-23 DIAGNOSIS — R0781 Pleurodynia: Secondary | ICD-10-CM | POA: Diagnosis not present

## 2020-09-23 DIAGNOSIS — R071 Chest pain on breathing: Secondary | ICD-10-CM

## 2020-09-23 DIAGNOSIS — Y92410 Unspecified street and highway as the place of occurrence of the external cause: Secondary | ICD-10-CM | POA: Diagnosis not present

## 2020-09-23 DIAGNOSIS — T07XXXA Unspecified multiple injuries, initial encounter: Secondary | ICD-10-CM | POA: Diagnosis not present

## 2020-09-23 DIAGNOSIS — S2221XA Fracture of manubrium, initial encounter for closed fracture: Secondary | ICD-10-CM

## 2020-09-23 LAB — CBC WITH DIFFERENTIAL/PLATELET
Abs Immature Granulocytes: 0.05 10*3/uL (ref 0.00–0.07)
Basophils Absolute: 0 10*3/uL (ref 0.0–0.1)
Basophils Relative: 0 %
Eosinophils Absolute: 0.1 10*3/uL (ref 0.0–0.5)
Eosinophils Relative: 1 %
HCT: 38.8 % (ref 36.0–46.0)
Hemoglobin: 13.7 g/dL (ref 12.0–15.0)
Immature Granulocytes: 1 %
Lymphocytes Relative: 13 %
Lymphs Abs: 1.3 10*3/uL (ref 0.7–4.0)
MCH: 28.7 pg (ref 26.0–34.0)
MCHC: 35.3 g/dL (ref 30.0–36.0)
MCV: 81.2 fL (ref 80.0–100.0)
Monocytes Absolute: 0.8 10*3/uL (ref 0.1–1.0)
Monocytes Relative: 7 %
Neutro Abs: 8.1 10*3/uL — ABNORMAL HIGH (ref 1.7–7.7)
Neutrophils Relative %: 78 %
Platelets: 184 10*3/uL (ref 150–400)
RBC: 4.78 MIL/uL (ref 3.87–5.11)
RDW: 12.1 % (ref 11.5–15.5)
WBC: 10.4 10*3/uL (ref 4.0–10.5)
nRBC: 0 % (ref 0.0–0.2)

## 2020-09-23 LAB — URINALYSIS, ROUTINE W REFLEX MICROSCOPIC
Bilirubin Urine: NEGATIVE
Glucose, UA: NEGATIVE mg/dL
Hgb urine dipstick: NEGATIVE
Ketones, ur: NEGATIVE mg/dL
Leukocytes,Ua: NEGATIVE
Nitrite: NEGATIVE
Protein, ur: NEGATIVE mg/dL
Specific Gravity, Urine: 1.013 (ref 1.005–1.030)
pH: 7 (ref 5.0–8.0)

## 2020-09-23 LAB — COMPREHENSIVE METABOLIC PANEL
ALT: 20 U/L (ref 0–44)
AST: 21 U/L (ref 15–41)
Albumin: 3.8 g/dL (ref 3.5–5.0)
Alkaline Phosphatase: 68 U/L (ref 38–126)
Anion gap: 12 (ref 5–15)
BUN: 14 mg/dL (ref 8–23)
CO2: 29 mmol/L (ref 22–32)
Calcium: 9.2 mg/dL (ref 8.9–10.3)
Chloride: 87 mmol/L — ABNORMAL LOW (ref 98–111)
Creatinine, Ser: 0.59 mg/dL (ref 0.44–1.00)
GFR, Estimated: >60 mL/min (ref >60)
Glucose, Bld: 109 mg/dL — ABNORMAL HIGH (ref 70–99)
Potassium: 2.8 mmol/L — ABNORMAL LOW (ref 3.5–5.1)
Sodium: 128 mmol/L — ABNORMAL LOW (ref 135–145)
Total Bilirubin: 0.7 mg/dL (ref 0.3–1.2)
Total Protein: 6.5 g/dL (ref 6.5–8.1)

## 2020-09-23 MED ORDER — POTASSIUM CHLORIDE CRYS ER 20 MEQ PO TBCR
40.0000 meq | EXTENDED_RELEASE_TABLET | Freq: Once | ORAL | Status: AC
  Administered 2020-09-23: 40 meq via ORAL
  Filled 2020-09-23: qty 2

## 2020-09-23 MED ORDER — HYDROCODONE-ACETAMINOPHEN 5-325 MG PO TABS
1.0000 | ORAL_TABLET | Freq: Once | ORAL | Status: AC
  Administered 2020-09-23: 1 via ORAL
  Filled 2020-09-23: qty 1

## 2020-09-23 MED ORDER — IOHEXOL 300 MG/ML  SOLN
100.0000 mL | Freq: Once | INTRAMUSCULAR | Status: AC | PRN
  Administered 2020-09-23: 100 mL via INTRAVENOUS

## 2020-09-23 MED ORDER — FENTANYL CITRATE (PF) 100 MCG/2ML IJ SOLN
50.0000 ug | Freq: Once | INTRAMUSCULAR | Status: AC
  Administered 2020-09-23: 50 ug via INTRAVENOUS
  Filled 2020-09-23: qty 2

## 2020-09-23 MED ORDER — MORPHINE SULFATE (PF) 4 MG/ML IV SOLN
4.0000 mg | Freq: Once | INTRAVENOUS | Status: AC
  Administered 2020-09-23: 4 mg via INTRAVENOUS
  Filled 2020-09-23: qty 1

## 2020-09-23 MED ORDER — ONDANSETRON HCL 4 MG/2ML IJ SOLN
4.0000 mg | Freq: Once | INTRAMUSCULAR | Status: AC
  Administered 2020-09-23: 4 mg via INTRAVENOUS
  Filled 2020-09-23: qty 2
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
## 2020-09-23 NOTE — Discharge Instructions (Addendum)
Go to any of these emergency departments for comprehensive work-up and imaging.  Let them know if your pain changes, gets worse

## 2020-09-24 MED ORDER — MORPHINE SULFATE 15 MG PO TABS: 7.5000 mg | ORAL_TABLET | ORAL | 0 refills | Status: DC | PRN

## 2020-09-24 NOTE — Discharge Instructions (Addendum)
Use the incentive spirometer 44min out of every hour you are awake for the next few days.   Return for worsening difficulty breathing you develop a fever.  Please return for numbness or weakness to your legs difficulty with urination or stooling.  Numbness to your groin.  Take 4 over the counter ibuprofen tablets 3 times a day or 2 over-the-counter naproxen tablets twice a day for pain. Also take tylenol 1000mg (2 extra strength) four times a day.   Then take the pain medicine if you feel like you need it. Narcotics do not help with the pain, they only make you care about it less.  You can become addicted to this, people may break into your house to steal it.  It will constipate you.  If you drive under the influence of this medicine you can get a DUI.

## 2020-10-21 ENCOUNTER — Other Ambulatory Visit: Payer: Self-pay | Admitting: Hematology

## 2020-11-20 ENCOUNTER — Other Ambulatory Visit: Payer: Self-pay | Admitting: Orthopedic Surgery

## 2020-11-20 DIAGNOSIS — R600 Localized edema: Secondary | ICD-10-CM

## 2020-11-26 ENCOUNTER — Ambulatory Visit
Admission: RE | Admit: 2020-11-26 | Discharge: 2020-11-26 | Disposition: A | Discharge status: Home / Self Care | Payer: Medicare Other | Source: Ambulatory Visit | Attending: Orthopedic Surgery | Admitting: Orthopedic Surgery

## 2020-11-26 DIAGNOSIS — R600 Localized edema: Secondary | ICD-10-CM

## 2020-12-29 ENCOUNTER — Inpatient Hospital Stay: Payer: Medicare Other

## 2020-12-29 ENCOUNTER — Inpatient Hospital Stay: Payer: Medicare Other | Attending: Hematology | Admitting: Hematology

## 2021-01-02 ENCOUNTER — Telehealth: Payer: Self-pay | Admitting: Hematology

## 2021-01-02 NOTE — Telephone Encounter (Signed)
R/s appts per 7/1 sch msg. Pt aware.  

## 2021-01-26 ENCOUNTER — Telehealth: Payer: Self-pay | Admitting: Hematology

## 2021-01-26 NOTE — Telephone Encounter (Signed)
Scheduled appointment per 07/25 sch msg. Patient is aware. 

## 2021-01-27 NOTE — Progress Notes (Deleted)
[facility] OFFICE PROGRESS NOTE  Burnard Bunting, MD [place] [address]  DIAGNOSIS: F/u on left breast cancer  Oncology History Overview Note  Cancer Staging Malignant neoplasm of lower-inner quadrant of left breast in female, estrogen receptor positive ([facility]) Staging form: Breast, AJCC 8th Edition - Clinical stage from 01/16/2018: Stage IA (cT1b, cN0, cM0, G1, ER+, PR+, HER2-) - Signed by Truitt Merle, MD on 01/25/2018 - Pathologic stage from 02/21/2018: Stage IA (pT1c, pN0, cM0, G1, ER+, PR+, HER2-, Oncotype DX score: 18) - Signed by Truitt Merle, MD on 04/20/2018     Malignant neoplasm of lower-inner quadrant of left breast in female, estrogen receptor positive ([facility])  01/16/2018 Mammogram   Diagnostic Mammogram of left breast 01/16/18 IMPRESSION: 1.  There is a suspicious mass measuring 7x6x7 mm in the left breast at 8 o'clock position 1 cm from the nipple. 2.  No evidence of left axillary lymphadenopathy. RECOMMENDATION: Ultrasound guided biopsy is recommended for the left breast mass.     01/16/2018 Initial Biopsy   Diagnosis 01/16/18 Breast, left, needle core biopsy, 8 o'clock - INVASIVE DUCTAL CARCINOMA. - DUCTAL CARCINOMA IN SITU.     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 T AIP,ALK, APC, ATM, AXIN2,BAP1,  BARD1, BLM, BMPR1A, BRCA1, BRCA2, BRIP1, BUB1B, CASR, CDC73, CDH1, CDK4, CDKN1B, CDKN1C, CDKN2A (p14ARF), CDKN2A (p16INK4a), CEBPA, CEP57, CHEK2, CTNNA1, DICER1, DIS3L2, EGFR (c.2369C>T, p.Thr790Met variant only), ENG, EPCAM (Deletion/duplication testing only), FH, FLCN, GATA2, GALNT12, GPC3, GREM1 (Promoter region deletion/duplication testing only), HOXB13 (c.251G>A, p.Gly84Glu), HRAS, KIT, MAX, MEN1, MET, MITF (c.952G>A, p.Glu318Lys variant only), MLH1, MSH2, MSH3, MSH6, MUTYH, NBN, NF1, NF2, NTHL1, PALB2, PDGFRA, PHOX2B, PMS2, POLD1, POLE, POT1, PRKAR1A, PTCH1, PTEN, RAD50, RAD51C, RAD51D, RB1, RECQL4, RET, RUNX1, RSP20, RNF43, SDHAF2, SDHA (sequence changes only),SDHAF2, SDHB, SDHC, SDHD, SMAD4, SMARCA4, SMARCB1, SMARCE1, STK11, SUFU, TERC, TERT, TMEM127, TP53, TSC1, TSC2, VHL, WRN and WT1.   Results: No pathogenic variants identified.  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  The date of this test report is 02/09/2018.      02/21/2018 Surgery   She had right breast lumpectomy with Dr. Barry Dienes on 02/21/2018.   02/21/2018 Pathology Results   02/21/2018 Surgical Pathology Diagnosis 1. Breast, lumpectomy, Left - INVASIVE AND IN SITU DUCTAL CARCINOMA, 1.2 CM, GRADE I. - MARGINS NOT INVOLVED. - PREVIOUS BIOPSY SITE AND BIOPSY CLIP. - FIBROCYSTIC CHANGES WITH USUAL DUCTAL HYPERPLASIA. 2. Lymph node, sentinel, biopsy, Left Axillary #1 - ONE BENIGN LYMPH NODE (0/1). 3. Lymph node, sentinel, biopsy, Left Axillary #2 - ONE BENIGN LYMPH NODE (0/1).   02/21/2018 Oncotype testing   Oncotype:  Recurrence score 18  Distant recurrence risk at 9 years with Tamoxifen or AI alone is 5% Less than 1% benefit from adjuvant chemotherapy     02/21/2018 Cancer  Staging   Staging form: Breast, AJCC 8th Edition - Pathologic stage from 02/21/2018: Stage IA (pT1c, pN0, cM0, G1, ER+, PR+,  HER2-, Oncotype DX score: 18) - Signed by Truitt Merle, MD on 04/20/2018    02/21/2018 Mammogram   02/21/2018 Mammogram FINDINGS: Status post excision of the left breast. The wire tip and biopsy marker clip are present and are marked for pathology. The locations of the radioactive seed and biopsy marker clip within the specimen were discussed with the OR staff during the procedure.   IMPRESSION: Specimen radiograph of the left breast.    03/27/2018 - 04/21/2018 Radiation Therapy   she started daily radiation therapy with Dr. Sondra Come on 03/27/2018-04/21/18    07/12/2018 Imaging   07/12/2018 DEXA ASSESSMENT: The BMD measured at Forearm Radius 33% is 0.745 g/cm2 with a T-score of -1.6. This patient is considered osteopenic according to [facility]) criteria.    07/2018 -  Anti-estrogen oral therapy   Adjuvant anastrozole, started 07/2018. Due to wroseneing fatigue she switched to Exemestane in 03/2019.      CURRENT THERAPY: Adjuvant anastrozole, started 07/2018. Due to worsening fatigue she switched to Exemestane in 03/2019.  INTERVAL HISTORY: [name] 75 y.o. female returns to clinic today for follow-up visit.  The patient is feeling well today without any concerning complaints.  She is tolerating herExemestane well without any concerning adverse side effects except for _.  She is compliant with this.  She was supposed to have a mammogram in July 2022 which is scheduled for her_.  She denies any new bone pain.  She denies any breast lumps, lymphadenopathy, overlying skin changes, nipple discharge, or skin dimpling.  Her energy is Is.  The patient is taking iron supplements for her anemia.  The patient is here today for evaluation and repeat blood work._      MEDICAL HISTORY: Past Medical History:  Diagnosis Date   Anxiety    Arthritis    Depression    Family history of breast cancer    Family history of colon cancer    Family history of leukemia     Hypertension    Personal history of radiation therapy     ALLERGIES:  has No Known Allergies.  MEDICATIONS:  Current Outpatient Medications  Medication Sig Dispense Refill   ARIPiprazole (ABILIFY) 5 MG tablet Take 5 mg by mouth daily. In the morning.     Ascorbic Acid (VITAMIN C) 1000 MG tablet Take 1,000 mg by mouth daily.     carvedilol (COREG) 12.5 MG tablet Take 12.5 mg by mouth daily.      DULoxetine (CYMBALTA) 60 MG capsule Take 60 mg by mouth daily.     exemestane (AROMASIN) 25 MG tablet TAKE 1 TABLET (25 MG TOTAL) BY MOUTH DAILY AFTER BREAKFAST. 90 tablet 3   ibuprofen (ADVIL,MOTRIN) 200 MG tablet Take 400 mg by mouth every 8 (eight) hours as needed for mild pain (for pain .).      loratadine (CLARITIN) 10 MG tablet Take 10 mg by mouth daily.     LORazepam (ATIVAN) 1 MG tablet Take 1 mg by mouth every 8 (eight) hours as needed for anxiety.      meloxicam (MOBIC) 7.5 MG tablet Take 15 mg by mouth daily.  4   morphine (MSIR) 15 MG tablet Take 0.5 tablets (7.5 mg total) by mouth every 4 (four) hours as needed for severe pain. 5 tablet 0   olmesartan-hydrochlorothiazide (BENICAR HCT) 40-25 MG tablet Take 1 tablet  by mouth at bedtime.      omeprazole (PRILOSEC) 40 MG capsule Take 40 mg by mouth daily.     oxyCODONE (OXY IR/ROXICODONE) 5 MG immediate release tablet Take 0.5-1 tablets (2.5-5 mg total) by mouth every 4 (four) hours as needed for severe pain. 10 tablet 0   potassium chloride SA (K-DUR) 20 MEQ tablet Take 1 tablet (20 mEq total) by mouth daily. 7 tablet 0   Probiotic Product (PROBIOTIC PO) Take 1 capsule by mouth daily.     traZODone (DESYREL) 50 MG tablet Take 50 mg by mouth at bedtime.     No current facility-administered medications for this visit.    SURGICAL HISTORY:  Past Surgical History:  Procedure Laterality Date   BACK SURGERY     BREAST BIOPSY Left 03/10/2010   benign   BREAST BIOPSY Left 01/16/2018   BREAST LUMPECTOMY Left 02/21/2018   BREAST LUMPECTOMY  WITH RADIOACTIVE SEED AND SENTINEL LYMPH NODE BIOPSY Left 02/21/2018   Procedure: BREAST LUMPECTOMY WITH RADIOACTIVE SEED AND SENTINEL LYMPH NODE BIOPSY;  Surgeon: Stark Klein, MD;  Location: [facility];  Service: General;  Laterality: Left;   CHOLECYSTECTOMY     HYSTERECTOMY ABDOMINAL WITH SALPINGECTOMY     NECK SURGERY     TONSILLECTOMY      REVIEW OF SYSTEMS:   Review of Systems  Constitutional: Negative for appetite change, chills, fatigue, fever and unexpected weight change.  HENT:   Negative for mouth sores, nosebleeds, sore throat and trouble swallowing.   Eyes: Negative for eye problems and icterus.  Respiratory: Negative for cough, hemoptysis, shortness of breath and wheezing.   Cardiovascular: Negative for chest pain and leg swelling.  Gastrointestinal: Negative for abdominal pain, constipation, diarrhea, nausea and vomiting.  Genitourinary: Negative for bladder incontinence, difficulty urinating, dysuria, frequency and hematuria.   Musculoskeletal: Negative for back pain, gait problem, neck pain and neck stiffness.  Skin: Negative for itching and rash.  Neurological: Negative for dizziness, extremity weakness, gait problem, headaches, light-headedness and seizures.  Hematological: Negative for adenopathy. Does not bruise/bleed easily.  Psychiatric/Behavioral: Negative for confusion, depression and sleep disturbance. The patient is not nervous/anxious.     PHYSICAL EXAMINATION:  There were no vitals taken for this visit.  ECOG PERFORMANCE STATUS: {CHL ONC ECOG [id number]  Physical Exam  Constitutional: Oriented to person, place, and time and well-developed, well-nourished, and in no distress. No distress.  HENT:  Head: Normocephalic and atraumatic.  Mouth/Throat: Oropharynx is clear and moist. No oropharyngeal exudate.  Eyes: Conjunctivae are normal. Right eye exhibits no discharge. Left eye exhibits no discharge. No scleral icterus.  Neck: Normal range of motion. Neck  supple.  Cardiovascular: Normal rate, regular rhythm, normal heart sounds and intact distal pulses.   Pulmonary/Chest: Effort normal and breath sounds normal. No respiratory distress. No wheezes. No rales.  Abdominal: Soft. Bowel sounds are normal. Exhibits no distension and no mass. There is no tenderness.  Musculoskeletal: Normal range of motion. Exhibits no edema.  Lymphadenopathy:    No cervical adenopathy.  Neurological: Alert and oriented to person, place, and time. Exhibits normal muscle tone. Gait normal. Coordination normal.  Skin: Skin is warm and dry. No rash noted. Not diaphoretic. No erythema. No pallor.  Psychiatric: Mood, memory and judgment normal.  Vitals reviewed.  LABORATORY DATA: Lab Results  Component Value Date   WBC 10.4 09/23/2020   HGB 13.7 09/23/2020   HCT 38.8 09/23/2020   MCV 81.2 09/23/2020   PLT 184 09/23/2020  Chemistry      Component Value Date/Time   NA 128 (L) 09/23/2020 2107   K 2.8 (L) 09/23/2020 2107   CL 87 (L) 09/23/2020 2107   CO2 29 09/23/2020 2107   BUN 14 09/23/2020 2107   CREATININE 0.59 09/23/2020 2107   CREATININE 0.83 07/25/2020 0856      Component Value Date/Time   CALCIUM 9.2 09/23/2020 2107   ALKPHOS 68 09/23/2020 2107   AST 21 09/23/2020 2107   AST 13 (L) 07/25/2020 0856   ALT 20 09/23/2020 2107   ALT 11 07/25/2020 0856   BILITOT 0.7 09/23/2020 2107   BILITOT 0.4 07/25/2020 0856       RADIOGRAPHIC STUDIES:  No results found.   ASSESSMENT/PLAN:  [name] is a 75 y.o. female with     1. 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 Treated with left lumpectomy and radiation.  -She has started adjuvant anastrozole in 07/2018. Due to worsening fatigue she switched to Exemestane in 03/2019, tolerating very well. Continue for at least 5 years.  -She is clinically doing well. She has  fatigue from Exemestane. I encouraged her to remain active. She overall has no concerns.  -Continue surveillance. Next mammogram in 01/2021 *** -Continue Exemestane -F/u in 5 months with CT?***     2. Microcytic anemia -Per patient she had anemia when young and use to take oral iron. -Her last colonoscopy had several polyps. She did have GI bleeding in the past. She does have hemorrhoids and was offered banding. -She plans to have colonoscopy in early 2021.  -Resolved on 07/25/20 labs.      3. Osteopenia of left arm  -DEXA on 07/2018 revealed T score of -1.6 of left forearm radius. Will repeat every 2 years to monitor on AI.  -Her estimated risk of fracture is not very high, no need for bisphosphonate for now -She will continue oral calcium and vitamin D. -Next DEXA in 07/2020***   4. Lymphadenopathy -Given swelling of her stomach in late 2021, she had work up with Dr Barry Dienes. Her MRI abdomen in 04/2020 showed Nonspecific mild left para-aortic lymphadenopathy. Metastatic disease cannot be excluded. -Her repeat imagine with CT AP on 07/25/20 showed Stable appearance of borderline retroperitoneal lymph nodes. No new or progressive adenopathy identified.  -Plan to scan her once more in 05/2021. She is agreeable.  -Order placed today.     Plan  -Continue Exemestane  -Mammogram in 01/2021*** -Lab and f/u in *** months, Order CT   No orders of the defined types were placed in this encounter.    I spent {CHL ONC TIME VISIT - LOVFI:[id number]} counseling the patient face to face. The total time spent in the appointment was {CHL ONC TIME VISIT - ZYSAY:[id number]}.  Mattilyn Crites L Jaquetta Currier, PA-C 01/27/21

## 2021-01-30 ENCOUNTER — Inpatient Hospital Stay: Payer: Medicare Other | Admitting: Hematology

## 2021-01-30 ENCOUNTER — Inpatient Hospital Stay: Payer: Medicare Other

## 2021-01-30 ENCOUNTER — Other Ambulatory Visit: Payer: Self-pay | Admitting: Physician Assistant

## 2021-01-30 ENCOUNTER — Inpatient Hospital Stay: Payer: Medicare Other | Admitting: Physician Assistant

## 2021-01-30 ENCOUNTER — Telehealth: Payer: Self-pay | Admitting: Physician Assistant

## 2021-01-30 NOTE — Telephone Encounter (Signed)
Patient's spouse called in to cancel 07/29 appointment, does not want to currently reschedule at the moment. Patient will call back when ready.

## 2021-02-02 ENCOUNTER — Other Ambulatory Visit: Payer: Self-pay

## 2021-02-02 ENCOUNTER — Emergency Department (HOSPITAL_COMMUNITY): Payer: Medicare Other

## 2021-02-02 ENCOUNTER — Emergency Department (HOSPITAL_COMMUNITY)
Admission: EM | Admit: 2021-02-02 | Discharge: 2021-02-02 | Disposition: A | Discharge status: Home / Self Care | Payer: Medicare Other | Attending: Emergency Medicine | Admitting: Emergency Medicine

## 2021-02-02 ENCOUNTER — Encounter (HOSPITAL_COMMUNITY): Payer: Self-pay

## 2021-02-02 DIAGNOSIS — Z79899 Other long term (current) drug therapy: Secondary | ICD-10-CM | POA: Diagnosis not present

## 2021-02-02 DIAGNOSIS — Z853 Personal history of malignant neoplasm of breast: Secondary | ICD-10-CM | POA: Insufficient documentation

## 2021-02-02 DIAGNOSIS — I259 Chronic ischemic heart disease, unspecified: Secondary | ICD-10-CM | POA: Insufficient documentation

## 2021-02-02 DIAGNOSIS — I10 Essential (primary) hypertension: Secondary | ICD-10-CM | POA: Diagnosis not present

## 2021-02-02 DIAGNOSIS — R410 Disorientation, unspecified: Secondary | ICD-10-CM | POA: Insufficient documentation

## 2021-02-02 LAB — URINALYSIS, COMPLETE (UACMP) WITH MICROSCOPIC
Bilirubin Urine: NEGATIVE
Glucose, UA: NEGATIVE mg/dL
Hgb urine dipstick: NEGATIVE
Ketones, ur: NEGATIVE mg/dL
Leukocytes,Ua: NEGATIVE
Nitrite: NEGATIVE
Protein, ur: NEGATIVE mg/dL
Specific Gravity, Urine: 1.01 (ref 1.005–1.030)
pH: 5 (ref 5.0–8.0)

## 2021-02-02 LAB — COMPREHENSIVE METABOLIC PANEL
ALT: 20 U/L (ref 0–44)
AST: 21 U/L (ref 15–41)
Albumin: 3.7 g/dL (ref 3.5–5.0)
Alkaline Phosphatase: 76 U/L (ref 38–126)
Anion gap: 9 (ref 5–15)
BUN: 11 mg/dL (ref 8–23)
CO2: 27 mmol/L (ref 22–32)
Calcium: 9.3 mg/dL (ref 8.9–10.3)
Chloride: 99 mmol/L (ref 98–111)
Creatinine, Ser: 0.9 mg/dL (ref 0.44–1.00)
GFR, Estimated: >60 mL/min (ref >60)
Glucose, Bld: 92 mg/dL (ref 70–99)
Potassium: 3.4 mmol/L — ABNORMAL LOW (ref 3.5–5.1)
Sodium: 135 mmol/L (ref 135–145)
Total Bilirubin: 0.8 mg/dL (ref 0.3–1.2)
Total Protein: 6 g/dL — ABNORMAL LOW (ref 6.5–8.1)

## 2021-02-02 LAB — CBC WITH DIFFERENTIAL/PLATELET
Abs Immature Granulocytes: 0.03 10*3/uL (ref 0.00–0.07)
Basophils Absolute: 0 10*3/uL (ref 0.0–0.1)
Basophils Relative: 1 %
Eosinophils Absolute: 0.2 10*3/uL (ref 0.0–0.5)
Eosinophils Relative: 3 %
HCT: 41.6 % (ref 36.0–46.0)
Hemoglobin: 13.7 g/dL (ref 12.0–15.0)
Immature Granulocytes: 0 %
Lymphocytes Relative: 27 %
Lymphs Abs: 2.1 10*3/uL (ref 0.7–4.0)
MCH: 27.5 pg (ref 26.0–34.0)
MCHC: 32.9 g/dL (ref 30.0–36.0)
MCV: 83.4 fL (ref 80.0–100.0)
Monocytes Absolute: 0.6 10*3/uL (ref 0.1–1.0)
Monocytes Relative: 8 %
Neutro Abs: 4.7 10*3/uL (ref 1.7–7.7)
Neutrophils Relative %: 61 %
Platelets: 272 10*3/uL (ref 150–400)
RBC: 4.99 MIL/uL (ref 3.87–5.11)
RDW: 14 % (ref 11.5–15.5)
WBC: 7.6 10*3/uL (ref 4.0–10.5)
nRBC: 0 % (ref 0.0–0.2)

## 2021-02-02 LAB — MAGNESIUM: Magnesium: 1.8 mg/dL (ref 1.7–2.4)

## 2021-02-02 LAB — PROTIME-INR
INR: 1.1 (ref 0.8–1.2)
Prothrombin Time: 13.7 seconds (ref 11.4–15.2)
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
## 2021-02-02 NOTE — Discharge Instructions (Addendum)
Your chest x-ray does not show an infection.  I did not find an infection in your urine.  Your blood work was otherwise reassuring.  This does not mean that nothing is wrong.  You need to follow-up with your doctor in the office.  There are a lot of things that can make you confused.  Your doctor may need to change some of your medications.  They may want further testing or to refer you to a specialist.  Please return to the emergency department if you develop a fever or if there is some concern for safety at home.
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
## 2021-04-22 ENCOUNTER — Other Ambulatory Visit: Payer: Self-pay | Admitting: Orthopaedic Surgery

## 2021-04-22 DIAGNOSIS — M4716 Other spondylosis with myelopathy, lumbar region: Secondary | ICD-10-CM

## 2021-04-22 DIAGNOSIS — G894 Chronic pain syndrome: Secondary | ICD-10-CM

## 2021-04-22 DIAGNOSIS — M5106 Intervertebral disc disorders with myelopathy, lumbar region: Secondary | ICD-10-CM

## 2021-04-24 ENCOUNTER — Other Ambulatory Visit: Payer: Self-pay

## 2021-04-24 ENCOUNTER — Ambulatory Visit
Admission: RE | Admit: 2021-04-24 | Discharge: 2021-04-24 | Disposition: A | Discharge status: Home / Self Care | Payer: Medicare Other | Source: Ambulatory Visit | Attending: Orthopaedic Surgery | Admitting: Orthopaedic Surgery

## 2021-04-24 DIAGNOSIS — M4716 Other spondylosis with myelopathy, lumbar region: Secondary | ICD-10-CM

## 2021-04-24 DIAGNOSIS — M5106 Intervertebral disc disorders with myelopathy, lumbar region: Secondary | ICD-10-CM

## 2021-04-24 DIAGNOSIS — G894 Chronic pain syndrome: Secondary | ICD-10-CM

## 2021-04-24 MED ORDER — MEPERIDINE HCL 50 MG/ML IJ SOLN: 50.0000 mg | Freq: Once | INTRAMUSCULAR | Status: DC | PRN

## 2021-04-24 MED ORDER — DIAZEPAM 5 MG PO TABS
5.0000 mg | ORAL_TABLET | Freq: Once | ORAL | Status: AC
  Administered 2021-04-24: 5 mg via ORAL

## 2021-04-24 MED ORDER — ONDANSETRON HCL 4 MG/2ML IJ SOLN: 4.0000 mg | Freq: Once | INTRAMUSCULAR | Status: DC | PRN

## 2021-04-24 MED ORDER — IOPAMIDOL (ISOVUE-M 200) INJECTION 41%
15.0000 mL | Freq: Once | INTRAMUSCULAR | Status: AC
  Administered 2021-04-24: 15 mL via INTRATHECAL

## 2021-04-24 NOTE — Discharge Instructions (Signed)

## 2021-06-18 ENCOUNTER — Other Ambulatory Visit: Payer: Self-pay | Admitting: Orthopaedic Surgery

## 2021-06-18 DIAGNOSIS — M47816 Spondylosis without myelopathy or radiculopathy, lumbar region: Secondary | ICD-10-CM

## 2021-06-18 DIAGNOSIS — M47814 Spondylosis without myelopathy or radiculopathy, thoracic region: Secondary | ICD-10-CM

## 2021-07-02 ENCOUNTER — Ambulatory Visit
Admission: RE | Admit: 2021-07-02 | Discharge: 2021-07-02 | Disposition: A | Discharge status: Home / Self Care | Payer: Medicare Other | Source: Ambulatory Visit | Attending: Orthopaedic Surgery | Admitting: Orthopaedic Surgery

## 2021-07-02 ENCOUNTER — Telehealth: Payer: Self-pay

## 2021-07-02 ENCOUNTER — Other Ambulatory Visit: Payer: Self-pay

## 2021-07-02 DIAGNOSIS — M47814 Spondylosis without myelopathy or radiculopathy, thoracic region: Secondary | ICD-10-CM

## 2021-07-02 DIAGNOSIS — M47816 Spondylosis without myelopathy or radiculopathy, lumbar region: Secondary | ICD-10-CM

## 2021-07-02 MED ORDER — ONDANSETRON HCL 4 MG/2ML IJ SOLN: 4.0000 mg | Freq: Once | INTRAMUSCULAR | Status: DC | PRN

## 2021-07-02 MED ORDER — DIAZEPAM 5 MG PO TABS
5.0000 mg | ORAL_TABLET | Freq: Once | ORAL | Status: AC
  Administered 2021-07-02: 09:00:00 5 mg via ORAL

## 2021-07-02 MED ORDER — MEPERIDINE HCL 50 MG/ML IJ SOLN: 50.0000 mg | Freq: Once | INTRAMUSCULAR | Status: DC | PRN

## 2021-07-02 MED ORDER — IOPAMIDOL (ISOVUE-M 300) INJECTION 61%
10.0000 mL | Freq: Once | INTRAMUSCULAR | Status: AC
  Administered 2021-07-02: 10:00:00 10 mL via INTRATHECAL

## 2021-07-02 NOTE — Discharge Instructions (Signed)

## 2021-11-24 ENCOUNTER — Other Ambulatory Visit: Payer: Self-pay | Admitting: Internal Medicine

## 2021-11-24 DIAGNOSIS — Z1231 Encounter for screening mammogram for malignant neoplasm of breast: Secondary | ICD-10-CM

## 2021-12-15 ENCOUNTER — Ambulatory Visit
Admission: RE | Admit: 2021-12-15 | Discharge: 2021-12-15 | Disposition: A | Discharge status: Home / Self Care | Payer: Medicare Other | Source: Ambulatory Visit | Attending: Internal Medicine | Admitting: Internal Medicine

## 2021-12-15 DIAGNOSIS — Z1231 Encounter for screening mammogram for malignant neoplasm of breast: Secondary | ICD-10-CM
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
## 2022-12-07 ENCOUNTER — Other Ambulatory Visit: Payer: Self-pay | Admitting: Internal Medicine

## 2022-12-07 DIAGNOSIS — Z1231 Encounter for screening mammogram for malignant neoplasm of breast: Secondary | ICD-10-CM

## 2022-12-30 ENCOUNTER — Ambulatory Visit
Admission: RE | Admit: 2022-12-30 | Discharge: 2022-12-30 | Disposition: A | Discharge status: Home / Self Care | Payer: Medicare Other | Source: Ambulatory Visit | Attending: Internal Medicine | Admitting: Internal Medicine

## 2022-12-30 DIAGNOSIS — Z1231 Encounter for screening mammogram for malignant neoplasm of breast: Secondary | ICD-10-CM

## 2023-01-04 ENCOUNTER — Other Ambulatory Visit: Payer: Self-pay | Admitting: Internal Medicine

## 2023-01-04 DIAGNOSIS — R928 Other abnormal and inconclusive findings on diagnostic imaging of breast: Secondary | ICD-10-CM

## 2023-01-12 ENCOUNTER — Ambulatory Visit
Admission: RE | Admit: 2023-01-12 | Discharge: 2023-01-12 | Disposition: A | Discharge status: Home / Self Care | Payer: Medicare Other | Source: Ambulatory Visit | Attending: Internal Medicine | Admitting: Internal Medicine

## 2023-01-12 DIAGNOSIS — R928 Other abnormal and inconclusive findings on diagnostic imaging of breast: Secondary | ICD-10-CM

## 2023-05-25 ENCOUNTER — Emergency Department (HOSPITAL_COMMUNITY)
Admission: EM | Admit: 2023-05-25 | Discharge: 2023-05-26 | Disposition: A | Discharge status: Home / Self Care | Payer: Medicare Other | Attending: Emergency Medicine | Admitting: Emergency Medicine

## 2023-05-25 ENCOUNTER — Emergency Department (HOSPITAL_COMMUNITY): Payer: Medicare Other

## 2023-05-25 ENCOUNTER — Other Ambulatory Visit: Payer: Self-pay

## 2023-05-25 DIAGNOSIS — I7121 Aneurysm of the ascending aorta, without rupture: Secondary | ICD-10-CM | POA: Insufficient documentation

## 2023-05-25 DIAGNOSIS — E871 Hypo-osmolality and hyponatremia: Secondary | ICD-10-CM | POA: Diagnosis not present

## 2023-05-25 DIAGNOSIS — R051 Acute cough: Secondary | ICD-10-CM | POA: Diagnosis not present

## 2023-05-25 DIAGNOSIS — Z79899 Other long term (current) drug therapy: Secondary | ICD-10-CM | POA: Diagnosis not present

## 2023-05-25 DIAGNOSIS — E876 Hypokalemia: Secondary | ICD-10-CM | POA: Diagnosis not present

## 2023-05-25 DIAGNOSIS — R0602 Shortness of breath: Secondary | ICD-10-CM | POA: Diagnosis not present

## 2023-05-25 DIAGNOSIS — S72114A Nondisplaced fracture of greater trochanter of right femur, initial encounter for closed fracture: Secondary | ICD-10-CM | POA: Insufficient documentation

## 2023-05-25 DIAGNOSIS — Z1152 Encounter for screening for COVID-19: Secondary | ICD-10-CM | POA: Insufficient documentation

## 2023-05-25 DIAGNOSIS — M25551 Pain in right hip: Secondary | ICD-10-CM

## 2023-05-25 DIAGNOSIS — I10 Essential (primary) hypertension: Secondary | ICD-10-CM | POA: Insufficient documentation

## 2023-05-25 DIAGNOSIS — S72111A Displaced fracture of greater trochanter of right femur, initial encounter for closed fracture: Secondary | ICD-10-CM

## 2023-05-25 DIAGNOSIS — S79921A Unspecified injury of right thigh, initial encounter: Secondary | ICD-10-CM | POA: Diagnosis present

## 2023-05-25 DIAGNOSIS — W19XXXA Unspecified fall, initial encounter: Secondary | ICD-10-CM | POA: Diagnosis not present

## 2023-05-25 DIAGNOSIS — R059 Cough, unspecified: Secondary | ICD-10-CM

## 2023-05-25 DIAGNOSIS — Z853 Personal history of malignant neoplasm of breast: Secondary | ICD-10-CM | POA: Diagnosis not present

## 2023-05-25 LAB — COMPREHENSIVE METABOLIC PANEL
ALT: 82 U/L — ABNORMAL HIGH (ref 0–44)
AST: 36 U/L (ref 15–41)
Albumin: 3.6 g/dL (ref 3.5–5.0)
Alkaline Phosphatase: 85 U/L (ref 38–126)
Anion gap: 8 (ref 5–15)
BUN: 19 mg/dL (ref 8–23)
CO2: 25 mmol/L (ref 22–32)
Calcium: 8.8 mg/dL — ABNORMAL LOW (ref 8.9–10.3)
Chloride: 100 mmol/L (ref 98–111)
Creatinine, Ser: 0.72 mg/dL (ref 0.44–1.00)
GFR, Estimated: >60 mL/min (ref >60)
Glucose, Bld: 91 mg/dL (ref 70–99)
Potassium: 3.4 mmol/L — ABNORMAL LOW (ref 3.5–5.1)
Sodium: 133 mmol/L — ABNORMAL LOW (ref 135–145)
Total Bilirubin: 0.8 mg/dL (ref <1.2)
Total Protein: 6.8 g/dL (ref 6.5–8.1)

## 2023-05-25 LAB — RESP PANEL BY RT-PCR (RSV, FLU A&B, COVID)  RVPGX2
Influenza A by PCR: NEGATIVE
Influenza B by PCR: NEGATIVE
Resp Syncytial Virus by PCR: NEGATIVE
SARS Coronavirus 2 by RT PCR: NEGATIVE

## 2023-05-25 LAB — CBC WITH DIFFERENTIAL/PLATELET
Abs Immature Granulocytes: 0.12 10*3/uL — ABNORMAL HIGH (ref 0.00–0.07)
Basophils Absolute: 0.1 10*3/uL (ref 0.0–0.1)
Basophils Relative: 0 %
Eosinophils Absolute: 0.3 10*3/uL (ref 0.0–0.5)
Eosinophils Relative: 2 %
HCT: 40.8 % (ref 36.0–46.0)
Hemoglobin: 13.1 g/dL (ref 12.0–15.0)
Immature Granulocytes: 1 %
Lymphocytes Relative: 17 %
Lymphs Abs: 2.5 10*3/uL (ref 0.7–4.0)
MCH: 26.7 pg (ref 26.0–34.0)
MCHC: 32.1 g/dL (ref 30.0–36.0)
MCV: 83.1 fL (ref 80.0–100.0)
Monocytes Absolute: 1 10*3/uL (ref 0.1–1.0)
Monocytes Relative: 7 %
Neutro Abs: 10.4 10*3/uL — ABNORMAL HIGH (ref 1.7–7.7)
Neutrophils Relative %: 73 %
Platelets: 315 10*3/uL (ref 150–400)
RBC: 4.91 MIL/uL (ref 3.87–5.11)
RDW: 15.8 % — ABNORMAL HIGH (ref 11.5–15.5)
WBC: 14.3 10*3/uL — ABNORMAL HIGH (ref 4.0–10.5)
nRBC: 0 % (ref 0.0–0.2)

## 2023-05-25 LAB — I-STAT CG4 LACTIC ACID, ED: Lactic Acid, Venous: 0.8 mmol/L (ref 0.5–1.9)

## 2023-05-25 MED ORDER — HYDROMORPHONE HCL 1 MG/ML IJ SOLN
0.5000 mg | Freq: Once | INTRAMUSCULAR | Status: AC
  Administered 2023-05-25: 0.5 mg via INTRAVENOUS
  Filled 2023-05-25: qty 1

## 2023-05-25 MED ORDER — SODIUM CHLORIDE 0.9 % IV BOLUS
1000.0000 mL | Freq: Once | INTRAVENOUS | Status: AC
  Administered 2023-05-25: 1000 mL via INTRAVENOUS

## 2023-05-25 NOTE — ED Provider Notes (Signed)
Care assumed from Dr. Suezanne Jacquet,  patient with fall and greater trochanteric fracture pending CT report to make sure fracture does not extend to intertrochanteric area. Also has cough with dyspnea, being treated for pneumonia as outpatient, CT chest pending. If CT scans are negative, anticipate discharge with change in antibiotics, analgesia, ambulation as tolerated, orthopedic follow-up. May need admission for pain control and referral for rehabilitation.  CT scans show no evidence of pneumonia, no evidence of intertrochanteric fracture.  Incidental findings of stable pulmonary nodules, coronary artery calcification, ascending aortic aneurysm.  I have independently viewed the images, and agree with the radiologist's interpretation.  She will need to have follow-up CT scans to monitor her aortic aneurysm.  Patient was able to ambulate in the emergency department, prefers to go home.  I have ordered transitions of care consult to try to arrange for home physical therapy/Occupational Therapy.  In lieu of her persistent cough, I am ordering a prescription for prednisone and I am changing her antibiotics to doxycycline.  I have ordered oxycodone for pain.  I am referring her to orthopedics for follow-up.  I also note mild hypokalemia and I have ordered a dose of oral potassium and a prescription for potassium supplementation at home.  Results for orders placed or performed during the hospital encounter of 05/25/23  Resp panel by RT-PCR (RSV, Flu A&B, Covid) Anterior Nasal Swab   Specimen: Anterior Nasal Swab  Result Value Ref Range   SARS Coronavirus 2 by RT PCR NEGATIVE NEGATIVE   Influenza A by PCR NEGATIVE NEGATIVE   Influenza B by PCR NEGATIVE NEGATIVE   Resp Syncytial Virus by PCR NEGATIVE NEGATIVE  Comprehensive metabolic panel  Result Value Ref Range   Sodium 133 (L) 135 - 145 mmol/L   Potassium 3.4 (L) 3.5 - 5.1 mmol/L   Chloride 100 98 - 111 mmol/L   CO2 25 22 - 32 mmol/L   Glucose, Bld 91 70 -  99 mg/dL   BUN 19 8 - 23 mg/dL   Creatinine, Ser 1.61 0.44 - 1.00 mg/dL   Calcium 8.8 (L) 8.9 - 10.3 mg/dL   Total Protein 6.8 6.5 - 8.1 g/dL   Albumin 3.6 3.5 - 5.0 g/dL   AST 36 15 - 41 U/L   ALT 82 (H) 0 - 44 U/L   Alkaline Phosphatase 85 38 - 126 U/L   Total Bilirubin 0.8 <1.2 mg/dL   GFR, Estimated >09 >60 mL/min   Anion gap 8 5 - 15  CBC with Differential  Result Value Ref Range   WBC 14.3 (H) 4.0 - 10.5 K/uL   RBC 4.91 3.87 - 5.11 MIL/uL   Hemoglobin 13.1 12.0 - 15.0 g/dL   HCT 45.4 09.8 - 11.9 %   MCV 83.1 80.0 - 100.0 fL   MCH 26.7 26.0 - 34.0 pg   MCHC 32.1 30.0 - 36.0 g/dL   RDW 14.7 (H) 82.9 - 56.2 %   Platelets 315 150 - 400 K/uL   nRBC 0.0 0.0 - 0.2 %   Neutrophils Relative % 73 %   Neutro Abs 10.4 (H) 1.7 - 7.7 K/uL   Lymphocytes Relative 17 %   Lymphs Abs 2.5 0.7 - 4.0 K/uL   Monocytes Relative 7 %   Monocytes Absolute 1.0 0.1 - 1.0 K/uL   Eosinophils Relative 2 %   Eosinophils Absolute 0.3 0.0 - 0.5 K/uL   Basophils Relative 0 %   Basophils Absolute 0.1 0.0 - 0.1 K/uL   Immature Granulocytes 1 %  Abs Immature Granulocytes 0.12 (H) 0.00 - 0.07 K/uL  I-Stat Lactic Acid  Result Value Ref Range   Lactic Acid, Venous 0.8 0.5 - 1.9 mmol/L  I-Stat Lactic Acid  Result Value Ref Range   Lactic Acid, Venous 0.7 0.5 - 1.9 mmol/L   CT Chest Wo Contrast  Result Date: 05/26/2023 CLINICAL DATA:  Respiratory illness, nondiagnostic x-ray. EXAM: CT CHEST WITHOUT CONTRAST TECHNIQUE: Multidetector CT imaging of the chest was performed following the standard protocol without IV contrast. RADIATION DOSE REDUCTION: This exam was performed according to the departmental dose-optimization program which includes automated exposure control, adjustment of the mA and/or kV according to patient size and/or use of iterative reconstruction technique. COMPARISON:  09/23/2020. FINDINGS: Cardiovascular: Heart is normal in size and there is a trace pericardial effusion. Coronary artery  calcifications are noted. There is atherosclerotic calcification of the aorta with aneurysmal dilatation of the ascending aorta measuring 4.2 cm. The pulmonary trunk is normal in caliber. Mediastinum/Nodes: No mediastinal or axillary lymphadenopathy. Surgical clips are noted in the left axilla and breast. The thyroid gland, trachea, and esophagus are within normal limits. There is a small hiatal hernia. Lungs/Pleura: Biapical pleural and parenchymal scarring and bronchiectasis are present bilaterally. Atelectasis or scarring is present at the lung bases. No effusion or pneumothorax. Scattered pulmonary nodules are seen bilaterally measuring up to 4 mm and not significantly changed from the prior exam. Upper Abdomen: The gallbladder is surgically absent. No acute abnormality. Musculoskeletal: Cervical, thoracic, and lumbar spinal fusion hardware is noted. There are stable compression deformities with augmentation in the mid and lower thoracic spine. No acute osseous abnormality is seen. IMPRESSION: 1. No acute intrathoracic process. 2. Bronchiectasis with scattered atelectasis or scarring. 3. Bilateral pulmonary nodules measuring up to 4 mm, not significantly changed from 2022. 4. Coronary artery calcification. 5. Aortic atherosclerosis with aneurysmal dilatation of the ascending aorta measuring 4.2 cm. Recommend annual imaging followup by CTA or MRA. This recommendation follows 2010 ACCF/AHA/AATS/ACR/ASA/SCA/SCAI/SIR/STS/SVM Guidelines for the Diagnosis and Management of Patients with Thoracic Aortic Disease. Circulation. 2010; 121: Z610-R604. Aortic aneurysm NOS (ICD10-I71.9) Electronically Signed   By: Thornell Sartorius M.D.   On: 05/26/2023 01:05   CT Hip Right Wo Contrast  Result Date: 05/25/2023 CLINICAL DATA:  Evaluate for possible intratrochanteric fracture EXAM: CT OF THE RIGHT HIP WITHOUT CONTRAST TECHNIQUE: Multidetector CT imaging of the right hip was performed according to the standard protocol.  Multiplanar CT image reconstructions were also generated. RADIATION DOSE REDUCTION: This exam was performed according to the departmental dose-optimization program which includes automated exposure control, adjustment of the mA and/or kV according to patient size and/or use of iterative reconstruction technique. COMPARISON:  Plain film from earlier in the same day. FINDINGS: Bones/Joint/Cartilage Visualized pelvic ring is intact. Mildly displaced greater trochanter fracture is seen without evidence of extension into the intratrochanteric region. Lesser trochanter and femoral neck appear within normal limits. No joint effusion is seen. Ligaments Suboptimally assessed by CT. Muscles and Tendons Surrounding musculature appears within normal limits. Soft tissues Mild soft tissue swelling is noted related to the recent injury. IMPRESSION: Fracture through the greater trochanter without extension into the intratrochanteric region. Electronically Signed   By: Alcide Clever M.D.   On: 05/25/2023 23:36   DG Chest 2 View  Result Date: 05/25/2023 CLINICAL DATA:  Fall. EXAM: CHEST - 2 VIEW COMPARISON:  02/02/2021. FINDINGS: The heart size and mediastinal contours are within normal limits. There is atherosclerotic calcification of the aorta. No consolidation, effusion, or pneumothorax.  Kyphoplasty changes are noted in the mid and lower thoracic spine. Cervical and thoracic spinal fusion hardware is present. Surgical clips are present over the left chest. IMPRESSION: No active cardiopulmonary disease. Electronically Signed   By: Thornell Sartorius M.D.   On: 05/25/2023 21:02   DG Hip Unilat W or Wo Pelvis 2-3 Views Right  Result Date: 05/25/2023 CLINICAL DATA:  Fall. EXAM: DG HIP (WITH OR WITHOUT PELVIS) 2-3V RIGHT COMPARISON:  None Available. FINDINGS: There is a nondisplaced fracture of the greater trochanter on the right with possible intertrochanteric extension. There is no dislocation of the hips bilaterally. Old healed  fractures of the inferior and superior pubic rami on the left are noted. Lumbar spinal fusion hardware is noted. A neurostimulator device is seen on the right and projects over the sacrum on the left. IMPRESSION: Nondisplaced fracture of the greater trochanter on the right with possible intertrochanteric extension. CT is recommended for further evaluation. Electronically Signed   By: Thornell Sartorius M.D.   On: 05/25/2023 21:01         Dione Booze, MD 05/26/23 [phone]

## 2023-05-25 NOTE — ED Provider Notes (Signed)
[place] EMERGENCY DEPARTMENT AT [facility] Provider Note  CSN: [id number] Arrival date & time: 05/25/23 1653  Chief Complaint(s) Shortness of Breath and Hip Pain  HPI [name] is a 77 y.o. female prior history of breast cancer presenting to the emergency department with multiple complaints.  Patient first reports cough.  She reports associated fatigue and generalized weakness, mild shortness of breath.  No chest pain, no recent travel or surgeries.  She was recently on antibiotics for pneumonia but reports no improvement.  No congestion.  She reports mild sore throat.  Symptoms have been present for around a week.  She also reports that she has right hip pain.  She had a ground-level fall in her driveway yesterday landing on her right hip.  Denies any head injury.  She reports she has been ambulatory.  No other pain or injuries from her fall.  She saw her primary doctor for these complaints today and they advised she come to the emergency department.   Past Medical History Past Medical History:  Diagnosis Date   Anxiety    Arthritis    Depression    Family history of breast cancer    Family history of colon cancer    Family history of leukemia    Hypertension    Personal history of radiation therapy    Patient Active Problem List   Diagnosis Date Noted   Genetic testing 02/14/2018   Family history of breast cancer    Family history of leukemia    Family history of colon cancer    Malignant neoplasm of lower-inner quadrant of left breast in female, estrogen receptor positive (HCC) 01/24/2018   Home Medication(s) Prior to Admission medications   Medication Sig Start Date End Date Taking? Authorizing Provider  acetaminophen (TYLENOL) 500 MG tablet Take 500 mg by mouth every 6 (six) hours as needed for moderate pain (pain score 4-6).   Yes [provider]  ARIPiprazole (ABILIFY) 5 MG tablet Take 5 mg by mouth daily. In the morning.   Yes  [provider]  Ascorbic Acid (VITAMIN C) 1000 MG tablet Take 1,000 mg by mouth daily.   Yes [provider]  ibuprofen (ADVIL,MOTRIN) 200 MG tablet Take 400 mg by mouth every 8 (eight) hours as needed for mild pain (for pain .).    Yes [provider]  loratadine (CLARITIN) 10 MG tablet Take 10 mg by mouth daily as needed for allergies.   Yes [provider]  meloxicam (MOBIC) 7.5 MG tablet Take 7.5 mg by mouth daily as needed for pain. 02/03/18  Yes [provider]  methocarbamol (ROBAXIN) 500 MG tablet Take 500 mg by mouth 3 (three) times daily as needed for muscle spasms.   Yes [provider]  MYRBETRIQ 50 MG TB24 tablet Take 50 mg by mouth daily as needed. 05/19/23  Yes [provider]  olmesartan-hydrochlorothiazide (BENICAR HCT) 40-25 MG tablet Take 1 tablet by mouth daily.   Yes [provider]  omeprazole (PRILOSEC) 40 MG capsule Take 40 mg by mouth daily as needed.   Yes [provider]  oxybutynin (DITROPAN-XL) 5 MG 24 hr tablet Take 5 mg by mouth daily as needed.   Yes [provider]  oxyCODONE-acetaminophen (PERCOCET) 10-325 MG tablet Take 1 tablet by mouth every 6 (six) hours as needed for pain.   Yes [provider]  potassium chloride SA (K-DUR) 20 MEQ tablet Take 1 tablet (20 mEq total) by mouth daily. Patient  taking differently: Take 20 mEq by mouth daily as needed. 01/15/19  Yes Malachy Mood, MD  traZODone (DESYREL) 50 MG tablet Take 50 mg by mouth at bedtime.   Yes [provider]  exemestane (AROMASIN) 25 MG tablet TAKE 1 TABLET (25 MG TOTAL) BY MOUTH DAILY AFTER BREAKFAST. Patient not taking: Reported on 05/25/2023 07/01/20   Malachy Mood, MD  morphine (MSIR) 15 MG tablet Take 0.5 tablets (7.5 mg total) by mouth every 4 (four) hours as needed for severe pain. Patient not taking: Reported on 05/25/2023 09/24/20   Melene Plan, DO  oxyCODONE (OXY IR/ROXICODONE) 5 MG immediate  release tablet Take 0.5-1 tablets (2.5-5 mg total) by mouth every 4 (four) hours as needed for severe pain. Patient not taking: Reported on 05/25/2023 02/21/18   Almond Lint, MD                                                                                                                                    Past Surgical History Past Surgical History:  Procedure Laterality Date   BACK SURGERY     BREAST BIOPSY Left 03/10/2010   benign   BREAST BIOPSY Left 01/16/2018   BREAST LUMPECTOMY Left 02/21/2018   BREAST LUMPECTOMY WITH RADIOACTIVE SEED AND SENTINEL LYMPH NODE BIOPSY Left 02/21/2018   Procedure: BREAST LUMPECTOMY WITH RADIOACTIVE SEED AND SENTINEL LYMPH NODE BIOPSY;  Surgeon: Almond Lint, MD;  Location: [facility] OR;  Service: General;  Laterality: Left;   CHOLECYSTECTOMY     HYSTERECTOMY ABDOMINAL WITH SALPINGECTOMY     NECK SURGERY     TONSILLECTOMY     Family History Family History  Problem Relation Age of Onset   Breast cancer Mother 16       deceased @ 36   Breast cancer Maternal Aunt        dx 30?, died in 2's   Breast cancer Maternal Aunt        dx 54?, died in 46's   Breast cancer Maternal Aunt        dx 66?, died in 21's   Leukemia Son 12       deceased @ 55   Stomach cancer Maternal Grandmother 24   Leukemia Maternal Uncle 86   Pancreatic cancer Paternal Uncle 13   Colon cancer Brother 17   Cancer - Other Brother        pituitary cancer 60's   Cancer Brother        neck/thyroid/throat cancer? 60's/70's   Skin cancer Brother    Leukemia Father        66's   Colon cancer Cousin 25    Social History Social History   Tobacco Use   Smoking status: Never   Smokeless tobacco: Never  Vaping Use   Vaping status: Never Used  Substance Use Topics   Alcohol use: Yes    Comment: social drinker   Drug use: No   Allergies  Patient has no known allergies.  Review of Systems Review of Systems  All other systems reviewed and are negative.   Physical  Exam Vital Signs  I have reviewed the triage vital signs BP (!) 145/111   Pulse 72   Temp 97.9 F (36.6 C) (Oral)   Resp 17   SpO2 95%  Physical Exam Vitals and nursing note reviewed.  Constitutional:      General: She is not in acute distress.    Appearance: She is well-developed.  HENT:     Head: Normocephalic and atraumatic.     Mouth/Throat:     Mouth: Mucous membranes are moist.  Eyes:     Pupils: Pupils are equal, round, and reactive to light.  Cardiovascular:     Rate and Rhythm: Normal rate and regular rhythm.     Heart sounds: No murmur heard. Pulmonary:     Effort: Pulmonary effort is normal. No respiratory distress.     Breath sounds: Rales (trace crackles diffusely) present.  Abdominal:     General: Abdomen is flat.     Palpations: Abdomen is soft.     Tenderness: There is no abdominal tenderness.  Musculoskeletal:        General: No tenderness.     Right lower leg: No edema.     Left lower leg: No edema.     Comments: Able to range the right hip with some discomfort, tenderness to palpation over the right hip.  No obvious other signs of trauma on physical examination.  No midline C, T, L-spine tenderness.  Skin:    General: Skin is warm and dry.  Neurological:     General: No focal deficit present.     Mental Status: She is alert. Mental status is at baseline.  Psychiatric:        Mood and Affect: Mood normal.        Behavior: Behavior normal.     ED Results and Treatments Labs (all labs ordered are listed, but only abnormal results are displayed) Labs Reviewed  COMPREHENSIVE METABOLIC PANEL - Abnormal; Notable for the following components:      Result Value   Sodium 133 (*)    Potassium 3.4 (*)    Calcium 8.8 (*)    ALT 82 (*)    All other components within normal limits  CBC WITH DIFFERENTIAL/PLATELET - Abnormal; Notable for the following components:   WBC 14.3 (*)    RDW 15.8 (*)    Neutro Abs 10.4 (*)    Abs Immature Granulocytes 0.12 (*)     All other components within normal limits  RESP PANEL BY RT-PCR (RSV, FLU A&B, COVID)  RVPGX2  I-STAT CG4 LACTIC ACID, ED  I-STAT CG4 LACTIC ACID, ED                                                                                                                          Radiology DG Chest 2 View  Result Date:  05/25/2023 CLINICAL DATA:  Fall. EXAM: CHEST - 2 VIEW COMPARISON:  02/02/2021. FINDINGS: The heart size and mediastinal contours are within normal limits. There is atherosclerotic calcification of the aorta. No consolidation, effusion, or pneumothorax. Kyphoplasty changes are noted in the mid and lower thoracic spine. Cervical and thoracic spinal fusion hardware is present. Surgical clips are present over the left chest. IMPRESSION: No active cardiopulmonary disease. Electronically Signed   By: Thornell Sartorius M.D.   On: 05/25/2023 21:02   DG Hip Unilat W or Wo Pelvis 2-3 Views Right  Result Date: 05/25/2023 CLINICAL DATA:  Fall. EXAM: DG HIP (WITH OR WITHOUT PELVIS) 2-3V RIGHT COMPARISON:  None Available. FINDINGS: There is a nondisplaced fracture of the greater trochanter on the right with possible intertrochanteric extension. There is no dislocation of the hips bilaterally. Old healed fractures of the inferior and superior pubic rami on the left are noted. Lumbar spinal fusion hardware is noted. A neurostimulator device is seen on the right and projects over the sacrum on the left. IMPRESSION: Nondisplaced fracture of the greater trochanter on the right with possible intertrochanteric extension. CT is recommended for further evaluation. Electronically Signed   By: Thornell Sartorius M.D.   On: 05/25/2023 21:01    Pertinent labs & imaging results that were available during my care of the patient were reviewed by me and considered in my medical decision making (see MDM for details).  Medications Ordered in ED Medications  HYDROmorphone (DILAUDID) injection 0.5 mg (has no administration in time  range)  sodium chloride 0.9 % bolus 1,000 mL (1,000 mLs Intravenous New Bag/Given 05/25/23 2238)                                                                                                                                     Procedures Procedures  (including critical care time)  Medical Decision Making / ED Course   MDM:  78 year old female presenting to the emergency department with shortness of breath and cough as well as right hip pain.  Patient overall well-appearing, vital signs reassuring with no fever, no tachypnea, no hypoxia.  X-ray of the right hip shows greater trochanteric fracture.  CT hip obtained which on my interpretation shows isolated greater trochanteric fracture without intertrochanteric extension.  Discussed with Dr. Yevette Edwards.  Pending final read, but if no evidence of intertrochanteric fracture then treatment is weightbearing as tolerated, having significant pain in trouble ambulating could consider admission for PT/OT, pain control.  Patient also with cough for around a week, recent course of antibiotics without significant improvement.  Did have some crackles on pulmonary examination as well as leukocytosis on labs.  Chest x-ray clear with no evidence of pneumonia.  Given pulmonary findings obtain CT chest to further evaluate for any occult pneumonia.  Signed out to oncoming provider Dr. Preston Fleeting pending CT results and reassessment.      Additional history obtained: -Additional history obtained from family  Lab Tests: -I ordered, reviewed, and interpreted labs.   The pertinent results include:   Labs Reviewed  COMPREHENSIVE METABOLIC PANEL - Abnormal; Notable for the following components:      Result Value   Sodium 133 (*)    Potassium 3.4 (*)    Calcium 8.8 (*)    ALT 82 (*)    All other components within normal limits  CBC WITH DIFFERENTIAL/PLATELET - Abnormal; Notable for the following components:   WBC 14.3 (*)    RDW 15.8 (*)    Neutro Abs  10.4 (*)    Abs Immature Granulocytes 0.12 (*)    All other components within normal limits  RESP PANEL BY RT-PCR (RSV, FLU A&B, COVID)  RVPGX2  I-STAT CG4 LACTIC ACID, ED  I-STAT CG4 LACTIC ACID, ED    Notable for leukocytosis    Imaging Studies ordered: I ordered imaging studies including XR hip, CXR On my interpretation imaging demonstrates no acute process  I independently visualized and interpreted imaging. I agree with the radiologist interpretation   Medicines ordered and prescription drug management: Meds ordered this encounter  Medications   sodium chloride 0.9 % bolus 1,000 mL   HYDROmorphone (DILAUDID) injection 0.5 mg    -I have reviewed the patients home medicines and have made adjustments as needed   Consultations Obtained: I requested consultation with the orthopedic surgeon,  and discussed lab and imaging findings as well as pertinent plan - they recommend: WBAT if isolated greater trochanteric   Reevaluation: After the interventions noted above, I reevaluated the patient and found that their symptoms have improved  Co morbidities that complicate the patient evaluation  Past Medical History:  Diagnosis Date   Anxiety    Arthritis    Depression    Family history of breast cancer    Family history of colon cancer    Family history of leukemia    Hypertension    Personal history of radiation therapy       Dispostion: Disposition decision including need for hospitalization was considered, and patient disposition pending at time of sign out.    Final Clinical Impression(s) / ED Diagnoses Final diagnoses:  Right hip pain  Cough, unspecified type     This chart was dictated using voice recognition software.  Despite best efforts to proofread,  errors can occur which can change the documentation meaning.    Lonell Grandchild, MD 05/25/23 2337

## 2023-05-25 NOTE — ED Triage Notes (Signed)
Pt arrives with c/o right hip pain from a fall yesterday, also has shortness of breath, no chest pain reported. Pt had PNE last week, took all of her abx, today WBC was found to be elevated at 15.4, PCP told her to come to the ED

## 2023-05-25 NOTE — ED Provider Triage Note (Signed)
Emergency Medicine Provider Triage Evaluation Note  [name] , a 77 y.o. female  was evaluated in triage.  Pt complains of pain in her right hip.  Patient reports she fell yesterday and hit her right hip.  Patient reports she recently had pneumonia.  Patient's primary care doctor did lab work and advised her to come to the emergency department.  Patient had an elevated white blood cell count.  Review of Systems  Positive: Right hip pain Negative: Fever.  Patient denies any impact of her head no loss of consciousness  Physical Exam  BP (!) 193/96   Pulse 66   Temp 97.7 F (36.5 C)   Resp 18   SpO2 97%  Gen:   Awake, no distress   Resp:  Normal effort  MSK:   Painright hip Other:    Medical Decision Making  Medically screening exam initiated at 5:55 PM.  Appropriate orders placed.  Dorathy Daft was informed that the remainder of the evaluation will be completed by another provider, this initial triage assessment does not replace that evaluation, and the importance of remaining in the ED until their evaluation is complete.     Elson Areas, [place] 05/25/23 1756

## 2023-05-26 LAB — I-STAT CG4 LACTIC ACID, ED: Lactic Acid, Venous: 0.7 mmol/L (ref 0.5–1.9)

## 2023-05-26 MED ORDER — POTASSIUM CHLORIDE CRYS ER 20 MEQ PO TBCR
40.0000 meq | EXTENDED_RELEASE_TABLET | Freq: Once | ORAL | Status: AC
  Administered 2023-05-26: 40 meq via ORAL
  Filled 2023-05-26: qty 2

## 2023-05-26 MED ORDER — PREDNISONE 20 MG PO TABS: ORAL_TABLET | ORAL | 0 refills | Status: DC

## 2023-05-26 MED ORDER — OXYCODONE-ACETAMINOPHEN 5-325 MG PO TABS: 1.0000 | ORAL_TABLET | Freq: Once | ORAL | Status: DC

## 2023-05-26 MED ORDER — DOXYCYCLINE HYCLATE 100 MG PO TABS
100.0000 mg | ORAL_TABLET | Freq: Once | ORAL | Status: AC
  Administered 2023-05-26: 100 mg via ORAL
  Filled 2023-05-26: qty 1

## 2023-05-26 MED ORDER — POTASSIUM CHLORIDE CRYS ER 20 MEQ PO TBCR: 20.0000 meq | EXTENDED_RELEASE_TABLET | Freq: Two times a day (BID) | ORAL | 0 refills | Status: AC

## 2023-05-26 MED ORDER — OXYCODONE-ACETAMINOPHEN 5-325 MG PO TABS
2.0000 | ORAL_TABLET | Freq: Once | ORAL | Status: AC
  Administered 2023-05-26: 2 via ORAL
  Filled 2023-05-26: qty 2

## 2023-05-26 MED ORDER — DOXYCYCLINE HYCLATE 100 MG PO CAPS: 100.0000 mg | ORAL_CAPSULE | Freq: Two times a day (BID) | ORAL | 0 refills | Status: DC

## 2023-05-26 MED ORDER — OXYCODONE HCL 10 MG PO TABS: 10.0000 mg | ORAL_TABLET | ORAL | 0 refills | Status: AC | PRN

## 2023-05-26 MED ORDER — PREDNISONE 20 MG PO TABS
40.0000 mg | ORAL_TABLET | Freq: Once | ORAL | Status: AC
  Administered 2023-05-26: 40 mg via ORAL
  Filled 2023-05-26: qty 2

## 2023-05-26 NOTE — ED Notes (Signed)
Pt ambulated vitals remained within normal limits. Pt initially complained of 4/10 pain as walking continued, pt pain decreased to an ache. Pt advised would like to discuss physical therapy options. Let pt know that might need to be discussed with her PCP.

## 2023-05-26 NOTE — Discharge Instructions (Addendum)
Apply ice to your hip.  Ice to be applied for 30 minutes at a time, 4 times a day.  Use your walker as needed.  For less severe pain, take acetaminophen and/or ibuprofen.  Add oxycodone as needed for more severe pain.  Follow-up with the orthopedic physician.  Stop taking your current antibiotics, start taking doxycycline and prednisone as prescribed today.  Return if symptoms or not being adequately controlled at home.  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  This will need to be followed with annual scans to make sure it is not growing.  Your primary care provider can order those.

## 2023-06-27 ENCOUNTER — Other Ambulatory Visit: Payer: Self-pay

## 2023-06-27 ENCOUNTER — Encounter (HOSPITAL_COMMUNITY): Payer: Self-pay

## 2023-06-27 ENCOUNTER — Emergency Department (HOSPITAL_COMMUNITY)
Admission: EM | Admit: 2023-06-27 | Discharge: 2023-06-28 | Disposition: A | Discharge status: Home / Self Care | Payer: Medicare Other | Attending: Emergency Medicine | Admitting: Emergency Medicine

## 2023-06-27 ENCOUNTER — Emergency Department (HOSPITAL_COMMUNITY): Payer: Medicare Other

## 2023-06-27 DIAGNOSIS — R531 Weakness: Secondary | ICD-10-CM | POA: Diagnosis present

## 2023-06-27 DIAGNOSIS — Z79899 Other long term (current) drug therapy: Secondary | ICD-10-CM | POA: Insufficient documentation

## 2023-06-27 DIAGNOSIS — K922 Gastrointestinal hemorrhage, unspecified: Secondary | ICD-10-CM | POA: Diagnosis not present

## 2023-06-27 DIAGNOSIS — I10 Essential (primary) hypertension: Secondary | ICD-10-CM | POA: Diagnosis not present

## 2023-06-27 DIAGNOSIS — E871 Hypo-osmolality and hyponatremia: Secondary | ICD-10-CM | POA: Diagnosis not present

## 2023-06-27 LAB — URINALYSIS, ROUTINE W REFLEX MICROSCOPIC
Bacteria, UA: NONE SEEN
Bilirubin Urine: NEGATIVE
Glucose, UA: NEGATIVE mg/dL
Ketones, ur: NEGATIVE mg/dL
Nitrite: NEGATIVE
Protein, ur: NEGATIVE mg/dL
Specific Gravity, Urine: 1.008 (ref 1.005–1.030)
pH: 6 (ref 5.0–8.0)

## 2023-06-27 LAB — CBC
HCT: 41.2 % (ref 36.0–46.0)
Hemoglobin: 13.4 g/dL (ref 12.0–15.0)
MCH: 27 pg (ref 26.0–34.0)
MCHC: 32.5 g/dL (ref 30.0–36.0)
MCV: 83.1 fL (ref 80.0–100.0)
Platelets: 279 10*3/uL (ref 150–400)
RBC: 4.96 MIL/uL (ref 3.87–5.11)
RDW: 14.8 % (ref 11.5–15.5)
WBC: 9.8 10*3/uL (ref 4.0–10.5)
nRBC: 0 % (ref 0.0–0.2)

## 2023-06-27 LAB — TSH: TSH: 2.162 u[IU]/mL (ref 0.350–4.500)

## 2023-06-27 LAB — BASIC METABOLIC PANEL
Anion gap: 9 (ref 5–15)
BUN: 19 mg/dL (ref 8–23)
CO2: 25 mmol/L (ref 22–32)
Calcium: 9 mg/dL (ref 8.9–10.3)
Chloride: 98 mmol/L (ref 98–111)
Creatinine, Ser: 0.86 mg/dL (ref 0.44–1.00)
GFR, Estimated: >60 mL/min (ref >60)
Glucose, Bld: 89 mg/dL (ref 70–99)
Potassium: 3.2 mmol/L — ABNORMAL LOW (ref 3.5–5.1)
Sodium: 132 mmol/L — ABNORMAL LOW (ref 135–145)

## 2023-06-27 LAB — CBG MONITORING, ED: Glucose-Capillary: 85 mg/dL (ref 70–99)

## 2023-06-27 MED ORDER — IOHEXOL 350 MG/ML SOLN
75.0000 mL | Freq: Once | INTRAVENOUS | Status: AC | PRN
  Administered 2023-06-27: 75 mL via INTRAVENOUS

## 2023-06-27 NOTE — ED Notes (Signed)
Pt. Unable to obtain urine for analysis.

## 2023-06-27 NOTE — ED Notes (Signed)
To CT

## 2023-06-27 NOTE — ED Notes (Signed)
Patient was assisted to the bathroom using a wheelchair.

## 2023-06-27 NOTE — ED Triage Notes (Signed)
Pt reports she "doesn't feel good for the last couple of days. Its Christmas so I'm worried about it." She reports she very weak and has had diarrhea. She reports she fell down 3 weeks ago and hurt her right hip. She has been using a walker. She reports right leg pain.

## 2023-06-27 NOTE — ED Provider Notes (Signed)
[place] EMERGENCY DEPARTMENT AT [facility] Provider Note   CSN: [id number] Arrival date & time: 06/27/23  1553     History  Chief Complaint  Patient presents with   Weakness    [name] is a 77 y.o. female.   Weakness Patient is been feeling bad for the last few days.  Reviewing notes however has been feeling somewhat bad for a month.  Had a fall a month ago with a greater trochanteric fracture on the right.  Had been feeling bad for a while at that time.  Feeling weak and has had some decreased oral intake.  Some mild diarrhea.  Continued pain in the right hip. Also states has had some vaginal bleeding.  Is postmenopausal.  Not had a large amount of blood.  States she just feels decreased appetite and does not want to eat.  Has lost some weight.    Past Medical History:  Diagnosis Date   Anxiety    Arthritis    Depression    Family history of breast cancer    Family history of colon cancer    Family history of leukemia    Hypertension    Personal history of radiation therapy    Past Surgical History:  Procedure Laterality Date   BACK SURGERY     BREAST BIOPSY Left 03/10/2010   benign   BREAST BIOPSY Left 01/16/2018   BREAST LUMPECTOMY Left 02/21/2018   BREAST LUMPECTOMY WITH RADIOACTIVE SEED AND SENTINEL LYMPH NODE BIOPSY Left 02/21/2018   Procedure: BREAST LUMPECTOMY WITH RADIOACTIVE SEED AND SENTINEL LYMPH NODE BIOPSY;  Surgeon: Almond Lint, MD;  Location: [facility] OR;  Service: General;  Laterality: Left;   CHOLECYSTECTOMY     HYSTERECTOMY ABDOMINAL WITH SALPINGECTOMY     NECK SURGERY     TONSILLECTOMY       Home Medications Prior to Admission medications   Medication Sig Start Date End Date Taking? Authorizing Provider  acetaminophen (TYLENOL) 500 MG tablet Take 500 mg by mouth every 6 (six) hours as needed for moderate pain (pain score 4-6).    [provider]  ARIPiprazole (ABILIFY) 5 MG tablet Take 5 mg by mouth daily. In  the morning.    [provider]  Ascorbic Acid (VITAMIN C) 1000 MG tablet Take 1,000 mg by mouth daily.    [provider]  doxycycline (VIBRAMYCIN) 100 MG capsule Take 1 capsule (100 mg total) by mouth 2 (two) times daily. One po bid x 7 days 05/26/23   Dione Booze, MD  exemestane (AROMASIN) 25 MG tablet TAKE 1 TABLET (25 MG TOTAL) BY MOUTH DAILY AFTER BREAKFAST. Patient not taking: Reported on 05/25/2023 07/01/20   Malachy Mood, MD  ibuprofen (ADVIL,MOTRIN) 200 MG tablet Take 400 mg by mouth every 8 (eight) hours as needed for mild pain (for pain .).     [provider]  loratadine (CLARITIN) 10 MG tablet Take 10 mg by mouth daily as needed for allergies.    [provider]  meloxicam (MOBIC) 7.5 MG tablet Take 7.5 mg by mouth daily as needed for pain. 02/03/18   [provider]  methocarbamol (ROBAXIN) 500 MG tablet Take 500 mg by mouth 3 (three) times daily as needed for muscle spasms.    [provider]  morphine (MSIR) 15 MG tablet Take 0.5 tablets (7.5 mg total) by mouth every 4 (four) hours as needed for severe pain. Patient not taking: Reported on 05/25/2023 09/24/20   Melene Plan, DO  MYRBETRIQ 50 MG TB24 tablet Take 50 mg by mouth daily as needed. 05/19/23   [provider]  olmesartan-hydrochlorothiazide (BENICAR HCT) 40-25 MG tablet Take 1 tablet by mouth daily.    [provider]  omeprazole (PRILOSEC) 40 MG capsule Take 40 mg by mouth daily as needed.    [provider]  oxybutynin (DITROPAN-XL) 5 MG 24 hr tablet Take 5 mg by mouth daily as needed.    [provider]  oxyCODONE 10 MG TABS Take 1 tablet (10 mg total) by mouth every 4 (four) hours as needed for severe pain (pain score 7-10). 05/26/23   Dione Booze, MD  oxyCODONE-acetaminophen (PERCOCET) 10-325 MG tablet Take 1 tablet by mouth every 6 (six) hours as needed for pain.    [provider]  potassium chloride SA (KLOR-CON M) 20 MEQ  tablet Take 1 tablet (20 mEq total) by mouth 2 (two) times daily. 05/26/23   Dione Booze, MD  predniSONE (DELTASONE) 20 MG tablet 2 tabs po daily x 4 days 05/26/23   Dione Booze, MD  traZODone (DESYREL) 50 MG tablet Take 50 mg by mouth at bedtime.    [provider]      Allergies    Patient has no known allergies.    Review of Systems   Review of Systems  Neurological:  Positive for weakness.    Physical Exam Updated Vital Signs BP (!) 195/107   Pulse 76   Temp 97.7 F (36.5 C) (Oral)   Resp 16   Ht 5\' 6"  (1.676 m)   Wt 64.4 kg   SpO2 97%   BMI 22.92 kg/m  Physical Exam Vitals reviewed.  HENT:     Head: Normocephalic.  Eyes:     Pupils: Pupils are equal, round, and reactive to light.  Cardiovascular:     Rate and Rhythm: Regular rhythm.  Pulmonary:     Breath sounds: No wheezing or rhonchi.  Abdominal:     Tenderness: There is no abdominal tenderness.  Musculoskeletal:        General: Tenderness present.     Comments: Tenderness right hip laterally.  Skin:    General: Skin is warm.  Neurological:     Mental Status: She is alert and oriented to person, place, and time.   Pelvic exam showed no bleeding.  Rectal exam did show some gross blood.  ED Results / Procedures / Treatments   Labs (all labs ordered are listed, but only abnormal results are displayed) Labs Reviewed  BASIC METABOLIC PANEL - Abnormal; Notable for the following components:      Result Value   Sodium 132 (*)    Potassium 3.2 (*)    All other components within normal limits  URINALYSIS, ROUTINE W REFLEX MICROSCOPIC - Abnormal; Notable for the following components:   Color, Urine STRAW (*)    Hgb urine dipstick MODERATE (*)    Leukocytes,Ua MODERATE (*)    All other components within normal limits  CBC  TSH  CBG MONITORING, ED    EKG None  Radiology No results found.  Procedures Procedures    Medications Ordered in ED Medications  iohexol (OMNIPAQUE) 350 MG/ML  injection 75 mL (75 mLs Intravenous Contrast Given 06/27/23 2239)    ED Course/ Medical Decision Making/ A&P                                 Medical Decision Making Amount  and/or Complexity of Data Reviewed Labs: ordered. Radiology: ordered.  Risk Prescription drug management.   Patient with generalized weakness.  Differential diagnoses long but includes causes such as dehydration, thyroid issues.  Malignancy.  Patient reportedly has had some bleeding that she thinks is vaginal but not sure that it is not rectal.  Will get pelvic exam to evaluate.  Hemoglobin reassuring however.  Mild hyponatremia.  Will get TSH.  Blood work overall reassuring.  Although rectal exam done and did show blood.  Will get CT scan to further evaluate.          Final Clinical Impression(s) / ED Diagnoses Final diagnoses:  Gastrointestinal hemorrhage, unspecified gastrointestinal hemorrhage type    Rx / DC Orders ED Discharge Orders     None         Benjiman Core, MD 06/27/23 2339

## 2023-06-28 MED ORDER — HYDROCORTISONE ACETATE 25 MG RE SUPP
25.0000 mg | Freq: Two times a day (BID) | RECTAL | 0 refills | Status: AC
Start: 2023-06-28 — End: ?

## 2023-06-28 NOTE — ED Provider Notes (Signed)
Signed out to me with CT pending.  CT has been read, no active problems.  Patient does have diverticulosis.  Reviewing her records, she has been seen previously at [facility] for internal hemorrhoids.  Bleeding was bright red, possibly secondary to the hemorrhoids.  Secondarily could be diverticular bleed but vitals and blood counts are stable.  Her GI doctor retired, would like a referral.  Prescribed Anusol HC, given return precautions.   Gilda Crease, MD 06/28/23 [phone]

## 2023-06-28 NOTE — ED Notes (Signed)
Patient verbalizes understanding of discharge instructions. Opportunity for questioning and answers were provided. Armband removed by staff, pt discharged from ED. Pt taken to ED entrance via wheel chair.  

## 2023-06-30 ENCOUNTER — Encounter: Payer: Self-pay | Admitting: Gastroenterology

## 2023-09-16 ENCOUNTER — Ambulatory Visit: Payer: Medicare Other | Admitting: Gastroenterology

## 2023-09-16 ENCOUNTER — Encounter: Payer: Self-pay | Admitting: Gastroenterology

## 2023-09-16 VITALS — BP 126/86 | HR 64 | Ht 66.0 in | Wt 141.4 lb

## 2023-09-16 DIAGNOSIS — K602 Anal fissure, unspecified: Secondary | ICD-10-CM | POA: Diagnosis not present

## 2023-09-16 DIAGNOSIS — K625 Hemorrhage of anus and rectum: Secondary | ICD-10-CM

## 2023-09-16 DIAGNOSIS — K5900 Constipation, unspecified: Secondary | ICD-10-CM

## 2023-09-16 DIAGNOSIS — K649 Unspecified hemorrhoids: Secondary | ICD-10-CM

## 2023-09-16 DIAGNOSIS — Z860100 Personal history of colon polyps, unspecified: Secondary | ICD-10-CM

## 2023-09-16 NOTE — Patient Instructions (Signed)
 We will request records from your last colonoscopy from Dr. Kinnie Scales .  We will request your medical and surgical history from Dr. Jacky Kindle.  Please purchase the following medications over the counter and take as directed: Miralax - Take as directed once to twice a day as needed to treat constipation  We have sent a prescription for Diltiazem gel to [facility]. You should apply a pea size amount to your rectum three times daily until your fissure is healed.  [facility]'s information is below: Address: [address], [place], [place] [address]  [phone]  *Please DO NOT go directly from our office to pick up this medication! Give the pharmacy 1 day to process the prescription as this is compounded and takes time to make.   Thank you for entrusting me with your care and for choosing [facility], Dr. Ileene Patrick     If your blood pressure at your visit was 140/90 or greater, please contact your primary care physician to follow up on this. ______________________________________________________  If you are age 40 or older, your body mass index should be between 23-30. Your Body mass index is 22.82 kg/m. If this is out of the aforementioned range listed, please consider follow up with your Primary Care Provider.  If you are age 94 or younger, your body mass index should be between 19-25. Your Body mass index is 22.82 kg/m. If this is out of the aformentioned range listed, please consider follow up with your Primary Care Provider.  ________________________________________________________  The [place] [facility] providers would like to encourage you to use [facility] to communicate with providers for non-urgent requests or questions.  Due to long hold times on the telephone, sending your provider a message by [facility] may be a faster and more efficient way to get a response.  Please allow 48 business hours for a response.  Please remember that this is for non-urgent  requests.  _______________________________________________________  Due to recent changes in healthcare laws, you may see the results of your imaging and laboratory studies on MyChart before your provider has had a chance to review them.  We understand that in some cases there may be results that are confusing or concerning to you. Not all laboratory results come back in the same time frame and the provider may be waiting for multiple results in order to interpret others.  Please give [place] 48 hours in order for your provider to thoroughly review all the results before contacting the office for clarification of your results.

## 2023-09-16 NOTE — Progress Notes (Signed)
 HPI :  78 year old female with a history of breast cancer, reported cognitive impairment, colon polyps, hemorrhoids, referred to [place] from Geoffry Paradise, MD for rectal bleeding.  Of note we have no records at the time of this visit.  I believe the patient is referred here for rectal bleeding although she is not sure.  She states she has been having rectal bleeding intermittently for the past year.  She states this is sporadic and can come and go.  Usually bright red blood noticed mostly on the toilet paper and not in the stool.  However she thinks this has been occurring a bit more frequently lately.  She was seen in the ER for rectal bleeding in December.  Her hemoglobin at that time was normal at 13.4.  The ED performed a CT scan showing diverticulosis but no active bleeding.  It was thought she may have hemorrhoidal related bleeding and referred here.  She states she has bowel movements about 2 times per week and has straining.  She does endorse symptoms of hemorrhoids in the past.  She does have rectal discomfort when passing stool.  She is not using anything for her bowels currently.  She states she has had a "stimulator" placed in her back to "control my bowels".  She had a very difficult time clarifying what exactly she had and when, and what for.  We have no records of that.  She points to a device that was placed in her back and she has scarring from this.  Again she cannot recall what service line placed this, why it was done, and when it happened.  It sounds like she may have had a colonoscopy in recent years although she cannot really clarify when.  She was seen for Dr. Kinnie Scales in the past.  I can see that she has had polyps removed and had hemorrhoid banding performed but I cannot tell when she had her last colonoscopy and what it showed.   CT abdomen / pelvis 06/27/23: IMPRESSION: 1. Sigmoid diverticulosis. No active diverticulitis. 2. Aortic atherosclerosis. 3. Right femoral greater  trochanteric fracture again noted.    Past Medical History:  Diagnosis Date   Anemia    Anxiety    Arthritis    Breast cancer (HCC)    left   Depression    Family history of breast cancer    Family history of colon cancer    Family history of leukemia    Fibromyalgia    History of adenomatous polyp of colon 12/15/2016   [facility]   History of bone marrow transplant (HCC) 1982   History of gallstones    Hypertension    Hypertension    Personal history of radiation therapy      Past Surgical History:  Procedure Laterality Date   BACK SURGERY     BREAST BIOPSY Left 03/10/2010   benign   BREAST BIOPSY Left 01/16/2018   BREAST LUMPECTOMY Left 02/21/2018   BREAST LUMPECTOMY WITH RADIOACTIVE SEED AND SENTINEL LYMPH NODE BIOPSY Left 02/21/2018   Procedure: BREAST LUMPECTOMY WITH RADIOACTIVE SEED AND SENTINEL LYMPH NODE BIOPSY;  Surgeon: Almond Lint, MD;  Location: [facility] OR;  Service: General;  Laterality: Left;   CHOLECYSTECTOMY     COLONOSCOPY     2013, 2018   HYSTERECTOMY ABDOMINAL WITH SALPINGECTOMY     NECK SURGERY     TONSILLECTOMY     Family History  Problem Relation Age of Onset   Breast cancer Mother 85  deceased @ 27   Breast cancer Maternal Aunt        dx 1?, died in 74's   Breast cancer Maternal Aunt        dx 57?, died in 56's   Breast cancer Maternal Aunt        dx 6?, died in 70's   Leukemia Son 12       deceased @ 7   Stomach cancer Maternal Grandmother 71   Leukemia Maternal Uncle 70   Pancreatic cancer Paternal Uncle 46   Colon cancer Brother 1   Cancer - Other Brother        pituitary cancer 25's   Cancer Brother        neck/thyroid/throat cancer? 60's/70's   Skin cancer Brother    Leukemia Father        41's   Colon cancer Cousin 8   Social History   Tobacco Use   Smoking status: Never   Smokeless tobacco: Never  Vaping Use   Vaping status: Never Used  Substance Use Topics   Alcohol use: Not Currently    Comment: occ   Drug  use: No   Current Outpatient Medications  Medication Sig Dispense Refill   amLODipine (NORVASC) 2.5 MG tablet Take 2.5 mg by mouth daily.     amLODipine (NORVASC) 5 MG tablet Take 5 mg by mouth daily.     Apoaequorin (PREVAGEN PO) Take 1 tablet by mouth daily.     aspirin EC 81 MG tablet Take 81 mg by mouth daily. Swallow whole.     carvedilol (COREG) 12.5 MG tablet Take 12.5 mg by mouth 2 (two) times daily with a meal.     meloxicam (MOBIC) 7.5 MG tablet Take 7.5-15 mg by mouth daily as needed for pain.  4   Multiple Vitamins-Minerals (CENTRUM MINIS WOMEN 50+) TABS Take 1 tablet by mouth daily.     omeprazole (PRILOSEC) 40 MG capsule Take 40 mg by mouth 2 (two) times daily.     oxybutynin (DITROPAN-XL) 10 MG 24 hr tablet Take 10 mg by mouth daily.     oxybutynin (DITROPAN-XL) 5 MG 24 hr tablet Take 5 mg by mouth at bedtime.     traZODone (DESYREL) 50 MG tablet Take 50 mg by mouth at bedtime.     hydrocortisone (ANUSOL-HC) 25 MG suppository Place 1 suppository (25 mg total) rectally 2 (two) times daily. (Patient not taking: Reported on 09/16/2023) 12 suppository 0   MYRBETRIQ 50 MG TB24 tablet Take 50 mg by mouth daily as needed.     oxyCODONE 10 MG TABS Take 1 tablet (10 mg total) by mouth every 4 (four) hours as needed for severe pain (pain score 7-10). 30 tablet 0   potassium chloride SA (KLOR-CON M) 20 MEQ tablet Take 1 tablet (20 mEq total) by mouth 2 (two) times daily. 10 tablet 0   No current facility-administered medications for this visit.   No Known Allergies   Review of Systems: All systems reviewed and negative except where noted in HPI.   Lab Results  Component Value Date   WBC 9.8 06/27/2023   HGB 13.4 06/27/2023   HCT 41.2 06/27/2023   MCV 83.1 06/27/2023   PLT 279 06/27/2023    Lab Results  Component Value Date   NA 132 (L) 06/27/2023   CL 98 06/27/2023   K 3.2 (L) 06/27/2023   CO2 25 06/27/2023   BUN 19 06/27/2023   CREATININE 0.86 06/27/2023   GFRNONAA  >  60 06/27/2023   CALCIUM 9.0 06/27/2023   ALBUMIN 3.6 05/25/2023   GLUCOSE 89 06/27/2023      Physical Exam: BP 126/86   Pulse 64   Ht 5\' 6"  (1.676 m)   Wt 141 lb 6 oz (64.1 kg)   BMI 22.82 kg/m  Constitutional: Pleasant,well-developed, female in no acute distress. HEENT: Normocephalic and atraumatic. Conjunctivae are normal. No scleral icterus. Neck supple.  Cardiovascular: Normal rate, regular rhythm.  Pulmonary/chest: Effort normal and breath sounds normal.  Abdominal: Soft, nondistended, nontender. There are no masses palpable.  DRE - CMA Lucius Conn as standby - posterior midline anal fissure, small external hemorrhoids, no mass lesions, normal squeeze pressure and decent Extremities: no edema Lymphadenopathy: No cervical adenopathy noted. Neurological: Alert and oriented to person place and time. Skin: Skin is warm and dry. No rashes noted. Psychiatric: Normal mood and affect. Behavior is normal.   ASSESSMENT: 78 y.o. female here for assessment of the following  1. Rectal bleeding   2. Anal fissure   3. Hemorrhoids, unspecified hemorrhoid type   4. Constipation, unspecified constipation type   5. History of colon polyps    Seen in the ED for rectal bleeding with normal hemoglobin and CT scan without concerning pathology.  She has had intermittent rectal bleeding, sounds superficial, for about a year, and no anemia.  She has baseline constipation and some rectal discomfort with bowel movements.  On DRE today she has an anal fissure in the posterior midline which I suspect is causing her bleeding and pain.  She also has hemorrhoids which could also be contributing to this.  I suspect she very likely has anorectal bleeding in the setting of constipation but I do need her prior colonoscopy report to clarify this.  We have no records at the time of this visit.  We will reach out to Dr. Jennye Boroughs office to get that record and I also need note from her last visit with her primary  care to clarify her medical and surgical history.  She has had some intervention to her back, perhaps spinal stimulator, unclear if she has had issues with incontinence etc. in the past, and what she had this done before but will need more records to help clarify.  We discussed recommendations for treatment of her constipation and anorectal disease.  Recommend MiraLAX daily and titrate up as needed for constipation, soft BM per day and avoid straining.  Wrote prescription for diltiazem/lidocaine ointment, pea-sized amount PR 3 times daily until fissure is healed.  If she has had a recent colonoscopy I do not think she needs another one as long as her symptoms improve with medical therapy.  If her symptoms persist despite medical therapy, may need to consider colonoscopy or if she is overdue based on results of her last exam.  She agreed and will contact me if symptoms persist while we await her records.   PLAN: - get records from Dr. Jennye Boroughs last colonoscopy - unclear when or what it showed - get records for medical / surgical history from Dr. Jacky Kindle - start Miralax daily and titrate up to BID if needed for constipation - diltiazem / lidocaine ointment - pea sized amount PR TID until healed - contact me if symptoms persist - may do colonoscopy if she is overdue or symptoms persist  Harlin Rain, MD [facility]  CC: Geoffry Paradise, MD

## 2023-09-27 ENCOUNTER — Telehealth: Payer: Self-pay | Admitting: Gastroenterology

## 2023-09-27 NOTE — Telephone Encounter (Signed)
 Records reviewed from Dr. Lanell Matar office.  Extensive charting reviewed.  I can see that she had a colonoscopy in 2018 but there is no report available, unknown what was seen.  Done at Dr. Jennye Boroughs office.  I also do not see any report of the stimulator placed in her spine etc.  She has had extensive lumbar surgery I can see.   Jan can you clarify if we requested records from Dr. Jennye Boroughs old office for colonoscopy in 2018? thanks

## 2023-09-28 NOTE — Telephone Encounter (Signed)
 Great, thanks!

## 2023-10-03 NOTE — Telephone Encounter (Signed)
Records rec'd and placed on Dr. Armbruster's desk. 

## 2023-10-10 ENCOUNTER — Telehealth: Payer: Self-pay | Admitting: Gastroenterology

## 2023-10-10 MED ORDER — AMBULATORY NON FORMULARY MEDICATION: 0 refills | Status: AC

## 2023-10-10 NOTE — Telephone Encounter (Signed)
 Called and spoke to patient. She never picked up the diltiazem/lidocaine ointment to use for her anal fissure. I let her know she can get that from [facility]. She is aware of where they are.  Script resent to [facility]

## 2023-10-10 NOTE — Telephone Encounter (Signed)
 Patient inquiring about a prescription that was supposed to be sent into pharmacy. Please advise.

## 2023-10-11 ENCOUNTER — Telehealth: Payer: Self-pay | Admitting: Gastroenterology

## 2023-10-11 DIAGNOSIS — I10 Essential (primary) hypertension: Secondary | ICD-10-CM | POA: Diagnosis not present

## 2023-10-11 NOTE — Telephone Encounter (Signed)
 Results of Dr. Jennye Boroughs colonoscopy arrived:  Done 12/14/2016 - 3 ascending polyps removed - sizes 9mm, 12mm, and 15mm, diverticulosis. They recommended a repeat exam in 3 years   She is very overdue for a colonoscopy, given her bleeding symptoms and advanced polyps I am recommending colonoscopy. Can you please schedule her if she is okay to do this, at the [facility]. Of note, I think she has some baseline dementia and not sure if there is family or care provider you can speak to about this to see if she has the support she needs to have an exam done. She should be on Miralax otherwise for her constipation.

## 2023-10-11 NOTE — Telephone Encounter (Signed)
 I have spoken to both patient and her husband to advise of Dr Lanetta Inch receipt of 2018 colonoscopy and his recommendation to repeat this procedure due to her history of polyps and recently bleeding. Both patient and her husband are agreeable to this. Patient has been scheduled for colonoscopy on 11/09/23 and patient has scheduled a telephone previsit on 11/02/23 for detailed prep instructions.

## 2023-11-02 ENCOUNTER — Ambulatory Visit (AMBULATORY_SURGERY_CENTER)

## 2023-11-02 ENCOUNTER — Encounter: Payer: Self-pay | Admitting: Gastroenterology

## 2023-11-02 VITALS — Ht 66.0 in | Wt 145.0 lb

## 2023-11-02 DIAGNOSIS — G894 Chronic pain syndrome: Secondary | ICD-10-CM | POA: Diagnosis not present

## 2023-11-02 DIAGNOSIS — Z79899 Other long term (current) drug therapy: Secondary | ICD-10-CM | POA: Diagnosis not present

## 2023-11-02 DIAGNOSIS — K625 Hemorrhage of anus and rectum: Secondary | ICD-10-CM

## 2023-11-02 DIAGNOSIS — Z860100 Personal history of colon polyps, unspecified: Secondary | ICD-10-CM

## 2023-11-02 DIAGNOSIS — Z79891 Long term (current) use of opiate analgesic: Secondary | ICD-10-CM | POA: Diagnosis not present

## 2023-11-02 DIAGNOSIS — M4325 Fusion of spine, thoracolumbar region: Secondary | ICD-10-CM | POA: Diagnosis not present

## 2023-11-02 MED ORDER — NA SULFATE-K SULFATE-MG SULF 17.5-3.13-1.6 GM/177ML PO SOLN: 1.0000 | Freq: Once | ORAL | 0 refills | Status: AC

## 2023-11-02 NOTE — Progress Notes (Signed)

## 2023-11-09 ENCOUNTER — Ambulatory Visit: Admitting: Gastroenterology

## 2023-11-09 ENCOUNTER — Encounter: Payer: Self-pay | Admitting: Gastroenterology

## 2023-11-09 VITALS — BP 145/80 | HR 70 | Temp 97.9°F | Resp 15 | Ht 66.0 in | Wt 145.0 lb

## 2023-11-09 DIAGNOSIS — M797 Fibromyalgia: Secondary | ICD-10-CM | POA: Diagnosis not present

## 2023-11-09 DIAGNOSIS — K635 Polyp of colon: Secondary | ICD-10-CM

## 2023-11-09 DIAGNOSIS — D123 Benign neoplasm of transverse colon: Secondary | ICD-10-CM | POA: Diagnosis not present

## 2023-11-09 DIAGNOSIS — Z1211 Encounter for screening for malignant neoplasm of colon: Secondary | ICD-10-CM | POA: Diagnosis not present

## 2023-11-09 DIAGNOSIS — K5730 Diverticulosis of large intestine without perforation or abscess without bleeding: Secondary | ICD-10-CM | POA: Diagnosis not present

## 2023-11-09 DIAGNOSIS — I10 Essential (primary) hypertension: Secondary | ICD-10-CM | POA: Diagnosis not present

## 2023-11-09 DIAGNOSIS — Q439 Congenital malformation of intestine, unspecified: Secondary | ICD-10-CM

## 2023-11-09 DIAGNOSIS — Z860101 Personal history of adenomatous and serrated colon polyps: Secondary | ICD-10-CM | POA: Diagnosis not present

## 2023-11-09 DIAGNOSIS — K625 Hemorrhage of anus and rectum: Secondary | ICD-10-CM

## 2023-11-09 DIAGNOSIS — K648 Other hemorrhoids: Secondary | ICD-10-CM | POA: Diagnosis not present

## 2023-11-09 DIAGNOSIS — D125 Benign neoplasm of sigmoid colon: Secondary | ICD-10-CM | POA: Diagnosis not present

## 2023-11-09 DIAGNOSIS — Z860100 Personal history of colon polyps, unspecified: Secondary | ICD-10-CM

## 2023-11-09 MED ORDER — SODIUM CHLORIDE 0.9 % IV SOLN: 500.0000 mL | INTRAVENOUS | Status: DC

## 2023-11-09 NOTE — Progress Notes (Signed)
 A/o x 3, VSS, gd SR's, pleased with anesthesia, report to RN

## 2023-11-09 NOTE — Progress Notes (Signed)
 Pt's states no medical or surgical changes since previsit or office visit.

## 2023-11-09 NOTE — Progress Notes (Signed)
 Called to room to assist during endoscopic procedure.  Patient ID and intended procedure confirmed with present staff. Received instructions for my participation in the procedure from the performing physician.

## 2023-11-09 NOTE — Progress Notes (Signed)
 [facility] History and Physical   Primary Care Physician:  Suan Elm, MD   Reason for Procedure:   History of colon polyps  Plan:    colonoscopy     HPI: [name] is a 78 y.o. female  here for colonoscopy surveillance - last exam 12/2016 - 3 polyps removed, 2 advanced, per Dr. Andriette Keeling.  Patient has had some rectal bleeding thought to be due to a fissure in the past. Patient denies any bowel symptoms at this time. No family history of colon cancer known. Otherwise feels well without any cardiopulmonary symptoms.   I have discussed risks / benefits of anesthesia and endoscopic procedure with Bethena Brothers and they wish to proceed with the exams as outlined today.    Past Medical History:  Diagnosis Date   Anemia    Anxiety    Arthritis    Breast cancer (HCC)    left   Depression    Family history of breast cancer    Family history of colon cancer    Family history of leukemia    Fibromyalgia    History of adenomatous polyp of colon 12/15/2016   [facility]   History of bone marrow transplant (HCC) 1982   History of gallstones    Hypertension    Hypertension    Personal history of radiation therapy     Past Surgical History:  Procedure Laterality Date   BACK SURGERY     BREAST BIOPSY Left 03/10/2010   benign   BREAST BIOPSY Left 01/16/2018   BREAST LUMPECTOMY Left 02/21/2018   BREAST LUMPECTOMY WITH RADIOACTIVE SEED AND SENTINEL LYMPH NODE BIOPSY Left 02/21/2018   Procedure: BREAST LUMPECTOMY WITH RADIOACTIVE SEED AND SENTINEL LYMPH NODE BIOPSY;  Surgeon: Lockie Rima, MD;  Location: [facility] OR;  Service: General;  Laterality: Left;   CHOLECYSTECTOMY     COLONOSCOPY     2013, 2018   HYSTERECTOMY ABDOMINAL WITH SALPINGECTOMY     NECK SURGERY     TONSILLECTOMY      Prior to Admission medications   Medication Sig Start Date End Date Taking? Authorizing Provider  amLODipine (NORVASC) 2.5 MG tablet Take 2.5 mg by mouth daily.   Yes  [provider]  amLODipine (NORVASC) 5 MG tablet Take 5 mg by mouth daily.   Yes [provider]  Apoaequorin (PREVAGEN PO) Take 1 tablet by mouth daily.   Yes [provider]  ARIPiprazole (ABILIFY) 5 MG tablet Take 1 tablet by mouth daily. 01/22/19  Yes [provider]  aspirin EC 81 MG tablet Take 81 mg by mouth daily. Swallow whole.   Yes [provider]  carvedilol (COREG) 12.5 MG tablet Take 12.5 mg by mouth 2 (two) times daily with a meal.   Yes [provider]  DULoxetine (CYMBALTA) 60 MG capsule Take 1 capsule by mouth daily. 12/09/18  Yes [provider]  loratadine (CLARITIN) 10 MG tablet Take 10 mg by mouth daily as needed. 02/14/19  Yes [provider]  omeprazole (PRILOSEC) 40 MG capsule Take 40 mg by mouth 2 (two) times daily.   Yes [provider]  oxybutynin (DITROPAN-XL) 10 MG 24 hr tablet Take 10 mg by mouth daily.   Yes [provider]  oxybutynin (DITROPAN-XL) 5 MG 24 hr tablet Take 5 mg by mouth at bedtime.   Yes [provider]  oxyCODONE  10 MG TABS Take 1 tablet (10 mg total) by mouth every 4 (four) hours as needed for severe pain (  pain score 7-10). 05/26/23  Yes Alissa April, MD  potassium chloride  SA (KLOR-CON  M) 20 MEQ tablet Take 1 tablet (20 mEq total) by mouth 2 (two) times daily. 05/26/23  Yes Alissa April, MD  traZODone (DESYREL) 50 MG tablet Take 50 mg by mouth at bedtime.   Yes [provider]  Acetaminophen  500 MG capsule Take by mouth every 6 (six) hours as needed. 06/28/23   [provider]  AMBULATORY NON FORMULARY MEDICATION Diltiazem gel with 2% lidocaine   Apply a pea sized amount into your rectum three times daily until healed Dispense 30 GM zero refill 10/10/23   Rilynn Habel, Lendon Queen, MD  cefdinir (OMNICEF) 300 MG capsule Take 300 mg by mouth as directed.    [provider]  diazepam  (VALIUM ) 5 MG tablet Take 5 mg by mouth as needed.  10/24/20   [provider]  fluticasone (FLONASE) 50 MCG/ACT nasal spray Place 2 sprays into both nostrils daily. 12/22/21   [provider]  fluticasone (FLOVENT HFA) 44 MCG/ACT inhaler Inhale 2 puffs into the lungs as needed.    [provider]  hydrocortisone  (ANUSOL -HC) 25 MG suppository Place 1 suppository (25 mg total) rectally 2 (two) times daily. Patient not taking: Reported on 09/16/2023 06/28/23   Ballard Bongo, MD  ibuprofen (ADVIL) 400 MG tablet Take 400 mg by mouth 3 (three) times daily as needed. 06/28/23   [provider]  lidocaine  (LIDODERM ) 5 % Place 1 patch onto the skin as needed. 07/24/21   [provider]  LORazepam (ATIVAN) 1 MG tablet Take 1 mg by mouth 2 (two) times daily as needed.    [provider]  meloxicam (MOBIC) 7.5 MG tablet Take 7.5-15 mg by mouth daily as needed for pain. 02/03/18   [provider]  Multiple Vitamins-Minerals (CENTRUM MINIS WOMEN 50+) TABS Take 1 tablet by mouth daily.    [provider]  MYRBETRIQ 50 MG TB24 tablet Take 50 mg by mouth daily as needed. 05/19/23   [provider]  oxyCODONE -acetaminophen  (PERCOCET) 10-325 MG tablet Take 1 tablet by mouth as directed. 09/28/23   [provider]    Current Outpatient Medications  Medication Sig Dispense Refill   amLODipine (NORVASC) 2.5 MG tablet Take 2.5 mg by mouth daily.     amLODipine (NORVASC) 5 MG tablet Take 5 mg by mouth daily.     Apoaequorin (PREVAGEN PO) Take 1 tablet by mouth daily.     ARIPiprazole (ABILIFY) 5 MG tablet Take 1 tablet by mouth daily.     aspirin EC 81 MG tablet Take 81 mg by mouth daily. Swallow whole.     carvedilol (COREG) 12.5 MG tablet Take 12.5 mg by mouth 2 (two) times daily with a meal.     DULoxetine (CYMBALTA) 60 MG capsule Take 1 capsule by mouth daily.     loratadine (CLARITIN) 10 MG tablet Take 10 mg by mouth daily as needed.     omeprazole (PRILOSEC) 40 MG  capsule Take 40 mg by mouth 2 (two) times daily.     oxybutynin (DITROPAN-XL) 10 MG 24 hr tablet Take 10 mg by mouth daily.     oxybutynin (DITROPAN-XL) 5 MG 24 hr tablet Take 5 mg by mouth at bedtime.     oxyCODONE  10 MG TABS Take 1 tablet (10 mg total) by mouth every 4 (four) hours as needed for severe pain (pain score 7-10). 30 tablet 0   potassium chloride  SA (KLOR-CON  M) 20 MEQ tablet Take  1 tablet (20 mEq total) by mouth 2 (two) times daily. 10 tablet 0   traZODone (DESYREL) 50 MG tablet Take 50 mg by mouth at bedtime.     Acetaminophen  500 MG capsule Take by mouth every 6 (six) hours as needed.     AMBULATORY NON FORMULARY MEDICATION Diltiazem gel with 2% lidocaine   Apply a pea sized amount into your rectum three times daily until healed Dispense 30 GM zero refill 30 g 0   cefdinir (OMNICEF) 300 MG capsule Take 300 mg by mouth as directed.     diazepam  (VALIUM ) 5 MG tablet Take 5 mg by mouth as needed.     fluticasone (FLONASE) 50 MCG/ACT nasal spray Place 2 sprays into both nostrils daily.     fluticasone (FLOVENT HFA) 44 MCG/ACT inhaler Inhale 2 puffs into the lungs as needed.     hydrocortisone  (ANUSOL -HC) 25 MG suppository Place 1 suppository (25 mg total) rectally 2 (two) times daily. (Patient not taking: Reported on 09/16/2023) 12 suppository 0   ibuprofen (ADVIL) 400 MG tablet Take 400 mg by mouth 3 (three) times daily as needed.     lidocaine  (LIDODERM ) 5 % Place 1 patch onto the skin as needed.     LORazepam (ATIVAN) 1 MG tablet Take 1 mg by mouth 2 (two) times daily as needed.     meloxicam (MOBIC) 7.5 MG tablet Take 7.5-15 mg by mouth daily as needed for pain.  4   Multiple Vitamins-Minerals (CENTRUM MINIS WOMEN 50+) TABS Take 1 tablet by mouth daily.     MYRBETRIQ 50 MG TB24 tablet Take 50 mg by mouth daily as needed.     oxyCODONE -acetaminophen  (PERCOCET) 10-325 MG tablet Take 1 tablet by mouth as directed.     Current Facility-Administered Medications  Medication Dose  Route Frequency Provider Last Rate Last Admin   0.9 %  sodium chloride  infusion  500 mL Intravenous Continuous Kemi Gell, Lendon Queen, MD        Allergies as of 11/09/2023   (No Known Allergies)    Family History  Problem Relation Age of Onset   Breast cancer Mother 70       deceased @ 79   Breast cancer Maternal Aunt        dx 54?, died in 29's   Breast cancer Maternal Aunt        dx 68?, died in 72's   Breast cancer Maternal Aunt        dx 16?, died in 54's   Leukemia Son 12       deceased @ 46   Stomach cancer Maternal Grandmother 26   Leukemia Maternal Uncle 94   Pancreatic cancer Paternal Uncle 53   Colon cancer Brother 61   Cancer - Other Brother        pituitary cancer 60's   Cancer Brother        neck/thyroid /throat cancer? 60's/70's   Skin cancer Brother    Leukemia Father        68's   Colon cancer Cousin 56    Social History   Socioeconomic History   Marital status: Married    Spouse name: Not on file   Number of children: Not on file   Years of education: Not on file   Highest education level: Not on file  Occupational History   Not on file  Tobacco Use   Smoking status: Never   Smokeless tobacco: Never  Vaping Use   Vaping status: Never Used  Substance  and Sexual Activity   Alcohol use: Not Currently    Comment: occ   Drug use: No   Sexual activity: Not on file  Other Topics Concern   Not on file  Social History Narrative   Not on file   Social Drivers of Health   Financial Resource Strain: Not on file  Food Insecurity: Not on file  Transportation Needs: Not on file  Physical Activity: Not on file  Stress: Not on file  Social Connections: Not on file  Intimate Partner Violence: Not on file    Review of Systems: All other review of systems negative except as mentioned in the HPI.  Physical Exam: Vital signs BP (!) 148/93   Pulse 72   Temp 97.9 F (36.6 C)   Ht 5\' 6"  (1.676 m)   Wt 145 lb (65.8 kg)   BMI 23.40 kg/m    General:   Alert,  Well-developed, pleasant and cooperative in NAD Lungs:  Clear throughout to auscultation.   Heart:  Regular rate and rhythm Abdomen:  Soft, nontender and nondistended.   Neuro/Psych:  Alert and cooperative. Normal mood and affect. A and O x 3  Christi Coward, MD [facility]

## 2023-11-09 NOTE — Patient Instructions (Signed)

## 2023-11-09 NOTE — Op Note (Signed)
 [facility] Patient Name: [name] Procedure Date: 11/09/2023 4:34 PM MRN: [id number] Endoscopist: Landon Pinion P. General Kenner , MD, [id number] Age: 78 Referring MD:  Date of Birth: 1946-04-04 Gender: Female Account #: [id number] Procedure:                Colonoscopy Indications:              High risk colon cancer surveillance: Personal                            history of colonic polyps - last exam 12/2016 - Dr.                            Andriette Keeling - 3 polyps - 2 advanced in size Medicines:                Monitored Anesthesia Care Procedure:                Pre-Anesthesia Assessment:                           - Prior to the procedure, a History and Physical                            was performed, and patient medications and                            allergies were reviewed. The patient's tolerance of                            previous anesthesia was also reviewed. The risks                            and benefits of the procedure and the sedation                            options and risks were discussed with the patient.                            All questions were answered, and informed consent                            was obtained. Prior Anticoagulants: The patient has                            taken no anticoagulant or antiplatelet agents. ASA                            Grade Assessment: II - A patient with mild systemic                            disease. After reviewing the risks and benefits,                            the patient was deemed in satisfactory condition to  undergo the procedure.                           After obtaining informed consent, the colonoscope                            was passed under direct vision. Throughout the                            procedure, the patient's blood pressure, pulse, and                            oxygen saturations were monitored continuously. The                            [id number]-[id number]  Colonoscope [id number] was introduced                            through the anus and advanced to the the cecum,                            identified by appendiceal orifice and ileocecal                            valve. The colonoscopy was performed without                            difficulty. The patient tolerated the procedure                            well. The quality of the bowel preparation was                            adequate. The ileocecal valve, appendiceal orifice,                            and rectum were photographed. Scope In: 4:42:16 PM Scope Out: 5:05:42 PM Scope Withdrawal Time: 0 hours 17 minutes 10 seconds  Total Procedure Duration: 0 hours 23 minutes 26 seconds  Findings:                 Prolapsing hemorrhoids were found on perianal exam                            and reduced.                           Two sessile polyps were found in the hepatic                            flexure. The polyps were 3 to 4 mm in size. These                            polyps were removed with a cold snare. Resection  and retrieval were complete.                           A 5 mm polyp was found in the transverse colon. The                            polyp was sessile. The polyp was removed with a                            cold snare. Resection and retrieval were complete.                           A 2 mm polyp was found in the sigmoid colon. The                            polyp was sessile. The polyp was removed with a                            cold snare. Resection and retrieval were complete.                           A 5 mm polyp was found in the sigmoid colon. The                            polyp was sessile. The polyp was removed with a                            cold snare. Resection and retrieval were complete.                           Multiple small-mouthed diverticula were found in                            the sigmoid colon and transverse  colon.                           The colon was tortuous. There was residual                            vegetable matter in the right colon - most of this                            was able to be lavaged to obtain adequate views in                            that area.                           Internal hemorrhoids were found during retroflexion                            with evidence of prolapse change  The exam was otherwise without abnormality. Complications:            No immediate complications. Estimated blood loss:                            Minimal. Estimated Blood Loss:     Estimated blood loss was minimal. Impression:               - Hemorrhoids found on perianal exam.                           - Two 3 to 4 mm polyps at the hepatic flexure,                            removed with a cold snare. Resected and retrieved.                           - One 5 mm polyp in the transverse colon, removed                            with a cold snare. Resected and retrieved.                           - One 2 mm polyp in the sigmoid colon, removed with                            a cold snare. Resected and retrieved.                           - One 5 mm polyp in the sigmoid colon, removed with                            a cold snare. Resected and retrieved.                           - Diverticulosis in the sigmoid colon and in the                            transverse colon.                           - Tortuous colon.                           - Internal hemorrhoids with prolapse change.                           - The examination was otherwise normal. Recommendation:           - Patient has a contact number available for                            emergencies. The signs and symptoms of potential  delayed complications were discussed with the                            patient. Return to normal activities tomorrow.                             Written discharge instructions were provided to the                            patient.                           - Resume previous diet.                           - Continue present medications.                           - Await pathology results. Landon Pinion P. Cathy Hun, MD 11/09/2023 5:15:08 PM This report has been signed electronically.

## 2023-11-10 ENCOUNTER — Telehealth: Payer: Self-pay

## 2023-11-10 NOTE — Telephone Encounter (Signed)
  Follow up Call-     11/09/2023    3:14 PM 07/02/2021    9:02 AM 04/24/2021    9:13 AM  Call back number  Post procedure Call Back phone  # [phone]  Permission to leave phone message Yes       Patient questions:  Do you have a fever, pain , or abdominal swelling? No. Pain Score  0 *  Have you tolerated food without any problems? Yes.    Have you been able to return to your normal activities? Yes.    Do you have any questions about your discharge instructions: Diet   No. Medications  No. Follow up visit  No.  Do you have questions or concerns about your Care? No.  Actions: * If pain score is 4 or above: No action needed, pain <4.

## 2023-11-14 LAB — SURGICAL PATHOLOGY

## 2023-11-16 ENCOUNTER — Ambulatory Visit: Payer: Self-pay | Admitting: Gastroenterology

## 2023-12-05 DIAGNOSIS — R053 Chronic cough: Secondary | ICD-10-CM | POA: Diagnosis not present

## 2023-12-05 DIAGNOSIS — G4709 Other insomnia: Secondary | ICD-10-CM | POA: Diagnosis not present

## 2023-12-05 DIAGNOSIS — I7090 Unspecified atherosclerosis: Secondary | ICD-10-CM | POA: Diagnosis not present

## 2023-12-05 DIAGNOSIS — I10 Essential (primary) hypertension: Secondary | ICD-10-CM | POA: Diagnosis not present

## 2023-12-20 ENCOUNTER — Other Ambulatory Visit: Payer: Self-pay | Admitting: Internal Medicine

## 2023-12-20 DIAGNOSIS — Z1231 Encounter for screening mammogram for malignant neoplasm of breast: Secondary | ICD-10-CM

## 2024-01-04 DIAGNOSIS — M47816 Spondylosis without myelopathy or radiculopathy, lumbar region: Secondary | ICD-10-CM | POA: Diagnosis not present

## 2024-01-04 DIAGNOSIS — M545 Low back pain, unspecified: Secondary | ICD-10-CM | POA: Diagnosis not present

## 2024-01-04 DIAGNOSIS — G894 Chronic pain syndrome: Secondary | ICD-10-CM | POA: Diagnosis not present

## 2024-01-04 DIAGNOSIS — Z981 Arthrodesis status: Secondary | ICD-10-CM | POA: Diagnosis not present

## 2024-01-09 ENCOUNTER — Ambulatory Visit: Admitting: Gastroenterology

## 2024-01-13 ENCOUNTER — Ambulatory Visit
Admission: RE | Admit: 2024-01-13 | Discharge: 2024-01-13 | Disposition: A | Discharge status: Home / Self Care | Source: Ambulatory Visit | Attending: Internal Medicine | Admitting: Internal Medicine

## 2024-01-13 DIAGNOSIS — Z1231 Encounter for screening mammogram for malignant neoplasm of breast: Secondary | ICD-10-CM | POA: Diagnosis not present

## 2024-03-06 DIAGNOSIS — G8929 Other chronic pain: Secondary | ICD-10-CM | POA: Diagnosis not present

## 2024-03-06 DIAGNOSIS — G894 Chronic pain syndrome: Secondary | ICD-10-CM | POA: Diagnosis not present

## 2024-03-06 DIAGNOSIS — M5441 Lumbago with sciatica, right side: Secondary | ICD-10-CM | POA: Diagnosis not present

## 2024-03-06 DIAGNOSIS — M4727 Other spondylosis with radiculopathy, lumbosacral region: Secondary | ICD-10-CM | POA: Diagnosis not present

## 2024-04-04 DIAGNOSIS — I10 Essential (primary) hypertension: Secondary | ICD-10-CM | POA: Diagnosis not present

## 2024-04-04 DIAGNOSIS — L237 Allergic contact dermatitis due to plants, except food: Secondary | ICD-10-CM | POA: Diagnosis not present
